# Patient Record
Sex: Female | Born: 1960 | Race: White | Hispanic: No | Marital: Married | Smoking: Never smoker
Health system: Southern US, Community
[De-identification: ages and names within clinical notes are randomized; demographics above are authoritative.]

## PROBLEM LIST (undated history)

## (undated) DIAGNOSIS — R112 Nausea with vomiting, unspecified: Secondary | ICD-10-CM

## (undated) DIAGNOSIS — T7840XA Allergy, unspecified, initial encounter: Secondary | ICD-10-CM

## (undated) DIAGNOSIS — N83209 Unspecified ovarian cyst, unspecified side: Secondary | ICD-10-CM

## (undated) DIAGNOSIS — Z923 Personal history of irradiation: Secondary | ICD-10-CM

## (undated) DIAGNOSIS — G43909 Migraine, unspecified, not intractable, without status migrainosus: Secondary | ICD-10-CM

## (undated) DIAGNOSIS — Z9889 Other specified postprocedural states: Secondary | ICD-10-CM

## (undated) DIAGNOSIS — T8859XA Other complications of anesthesia, initial encounter: Secondary | ICD-10-CM

## (undated) DIAGNOSIS — T4145XA Adverse effect of unspecified anesthetic, initial encounter: Secondary | ICD-10-CM

## (undated) DIAGNOSIS — C210 Malignant neoplasm of anus, unspecified: Secondary | ICD-10-CM

## (undated) DIAGNOSIS — K625 Hemorrhage of anus and rectum: Secondary | ICD-10-CM

## (undated) HISTORY — DX: Allergy, unspecified, initial encounter: T78.40XA

## (undated) HISTORY — DX: Malignant neoplasm of anus, unspecified: C21.0

## (undated) HISTORY — DX: Migraine, unspecified, not intractable, without status migrainosus: G43.909

## (undated) HISTORY — DX: Personal history of irradiation: Z92.3

## (undated) HISTORY — PX: HERNIA REPAIR: SHX51

## (undated) HISTORY — DX: Unspecified ovarian cyst, unspecified side: N83.209

## (undated) HISTORY — DX: Hemorrhage of anus and rectum: K62.5

---

## 1997-07-10 ENCOUNTER — Other Ambulatory Visit: Admission: RE | Admit: 1997-07-10 | Discharge: 1997-07-10 | Payer: Self-pay | Admitting: Gynecology

## 1999-06-28 ENCOUNTER — Encounter: Admission: RE | Admit: 1999-06-28 | Discharge: 1999-09-26 | Payer: Self-pay | Admitting: Obstetrics and Gynecology

## 1999-09-27 ENCOUNTER — Encounter: Admission: RE | Admit: 1999-09-27 | Discharge: 1999-12-26 | Payer: Self-pay | Admitting: Obstetrics and Gynecology

## 1999-12-28 ENCOUNTER — Encounter: Admission: RE | Admit: 1999-12-28 | Discharge: 2000-03-27 | Payer: Self-pay | Admitting: Obstetrics and Gynecology

## 2000-03-29 ENCOUNTER — Encounter: Admission: RE | Admit: 2000-03-29 | Discharge: 2000-05-20 | Payer: Self-pay | Admitting: Obstetrics and Gynecology

## 2000-07-14 ENCOUNTER — Emergency Department (HOSPITAL_COMMUNITY): Admission: EM | Admit: 2000-07-14 | Discharge: 2000-07-15 | Payer: Self-pay | Admitting: Emergency Medicine

## 2007-03-18 HISTORY — PX: OTHER SURGICAL HISTORY: SHX169

## 2009-01-19 ENCOUNTER — Encounter: Admission: RE | Admit: 2009-01-19 | Discharge: 2009-01-19 | Payer: Self-pay | Admitting: Family Medicine

## 2009-03-17 HISTORY — PX: BREAST ENHANCEMENT SURGERY: SHX7

## 2009-06-28 ENCOUNTER — Encounter: Admission: RE | Admit: 2009-06-28 | Discharge: 2009-06-28 | Payer: Self-pay | Admitting: Obstetrics and Gynecology

## 2009-12-12 ENCOUNTER — Ambulatory Visit: Payer: Self-pay | Admitting: Cardiology

## 2009-12-13 ENCOUNTER — Ambulatory Visit: Payer: Self-pay | Admitting: Cardiology

## 2011-07-17 ENCOUNTER — Other Ambulatory Visit: Payer: Self-pay | Admitting: Obstetrics and Gynecology

## 2011-07-17 DIAGNOSIS — Z1231 Encounter for screening mammogram for malignant neoplasm of breast: Secondary | ICD-10-CM

## 2011-07-21 ENCOUNTER — Ambulatory Visit: Payer: Self-pay

## 2011-07-30 ENCOUNTER — Ambulatory Visit
Admission: RE | Admit: 2011-07-30 | Discharge: 2011-07-30 | Disposition: A | Discharge status: Home / Self Care | Payer: 59 | Source: Ambulatory Visit | Attending: Obstetrics and Gynecology | Admitting: Obstetrics and Gynecology

## 2011-07-30 DIAGNOSIS — Z1231 Encounter for screening mammogram for malignant neoplasm of breast: Secondary | ICD-10-CM

## 2011-08-16 ENCOUNTER — Other Ambulatory Visit: Payer: Self-pay | Admitting: *Deleted

## 2011-08-16 ENCOUNTER — Encounter: Payer: Self-pay | Admitting: *Deleted

## 2012-05-10 ENCOUNTER — Encounter (INDEPENDENT_AMBULATORY_CARE_PROVIDER_SITE_OTHER): Payer: Self-pay | Admitting: Surgery

## 2012-05-11 ENCOUNTER — Ambulatory Visit (INDEPENDENT_AMBULATORY_CARE_PROVIDER_SITE_OTHER): Payer: 59 | Admitting: Surgery

## 2012-05-11 ENCOUNTER — Encounter (INDEPENDENT_AMBULATORY_CARE_PROVIDER_SITE_OTHER): Payer: Self-pay | Admitting: Surgery

## 2012-05-11 VITALS — BP 122/82 | HR 79 | Temp 98.0°F | Ht 63.0 in | Wt 111.0 lb

## 2012-05-11 DIAGNOSIS — K602 Anal fissure, unspecified: Secondary | ICD-10-CM

## 2012-05-11 MED ORDER — OXYCODONE-ACETAMINOPHEN 5-325 MG PO TABS: 1.0000 | ORAL_TABLET | ORAL | Status: DC | PRN

## 2012-05-11 NOTE — Progress Notes (Signed)
Patient ID: Leah Le, female   DOB: [birth year], 52 y.o.   MRN: [id number]  Chief Complaint  Patient presents with  . New Evaluation    fissure    HPI Leah Le is a 52 y.o. female.  Referred by Dr. Vida Rigger for evaluation of painful anal fissure HPI This is a 52 year old female in good health who presents with several months of severe perianal pain and hemorrhoids. The patient reports that she previously had regular daily bowel movements without constipation. However around makes giving she had an episode of constipation that required significant straining.  Since that time she has had worsening perianal pain. She has also noted some swelling anteriorly. Occasionally she sees blood on the toilet paper. She has been using an herbal laxative which helps to soften her stools. She has used multiple over-the-counter creams and wipes with no improvement. She feels constant fullness and pressure in her rectum. The pain has worsened to the point that she is unable to rest comfortably. She was seen last week by Dr. Ewing Schlein who diagnosed her with an anal fissure. She was started on the Rectiv nitroglycerin ointment as well as lidocaine ointment.  She has not noticed any improvement at all. She has not had any previous colonoscopies. Family history is negative for colon cancer but she does have a cousin with Crohn's disease.  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   Past Medical History  Diagnosis Date  . Allergy   . Migraine   . Ovarian cyst     Past Surgical History  Procedure Laterality Date  . Hernia repair    . Breast enhancement surgery    . Tummy tuck      Family History  Problem Relation Age of Onset  . Other Father     amyloidosis    Social History History  Substance Use Topics  . Smoking status: Never Smoker   . Smokeless tobacco: Never Used  . Alcohol Use: Yes     Comment: occasionally    Allergies  Allergen  Reactions  . Tetanus Toxoids     Current Outpatient Prescriptions  Medication Sig Dispense Refill  . ALPRAZolam (XANAX) 0.5 MG tablet Take 0.5 mg by mouth at bedtime as needed for sleep.      . Nutritional Supplements (JUICE PLUS FIBRE PO) Take by mouth.      . oxyCODONE-acetaminophen (PERCOCET/ROXICET) 5-325 MG per tablet Take 1 tablet by mouth every 4 (four) hours as needed for pain.  40 tablet  0   No current facility-administered medications for this visit.    Review of Systems Review of Systems  Constitutional: Negative for fever, chills and unexpected weight change.  HENT: Negative for hearing loss, congestion, sore throat, trouble swallowing and voice change.   Eyes: Negative for visual disturbance.  Respiratory: Negative for cough and wheezing.   Cardiovascular: Negative for chest pain, palpitations and leg swelling.  Gastrointestinal: Positive for diarrhea, blood in stool and rectal pain. Negative for nausea, vomiting, abdominal pain, constipation, abdominal distention and anal bleeding.  Genitourinary: Positive for pelvic pain. Negative for hematuria, vaginal bleeding and difficulty urinating.  Musculoskeletal: Negative for arthralgias.  Skin: Negative for rash and wound.  Neurological: Negative for seizures, syncope and headaches.  Hematological: Negative for adenopathy. Does not bruise/bleed easily.  Psychiatric/Behavioral: Negative for confusion.    Blood pressure 122/82, pulse 79, temperature 98 F (36.7 C), temperature source Temporal, height 5\' 3"  (1.6 m), weight  111 lb (50.349 kg).  Physical Exam Physical Exam WDWN in NAD HEENT:  EOMI, sclera anicteric Neck:  No masses, no thyromegaly Lungs:  CTA bilaterally; normal respiratory effort CV:  Regular rate and rhythm; no murmurs Abd:  +bowel sounds, soft, non-tender, no masses Rectal:  Small mildly inflamed anterior external hemorrhoid with adjacent fissure - moderately tender Left lateral enlarged external  hemorrhoid with apparent thrombosis and significant firmness/tenderness immediately adjacent to the hemorrhoid; no obvious sign of perirectal abscess Ext:  Well-perfused; no edema Skin:  Warm, dry; no sign of jaundice  Data Reviewed none  Assessment    Anterior midline skin tag and anal fissure Left lateral thrombosed hemorrhoid     Plan    PRN pain medication Frequent sitz baths Recommend examination under anesthesia with hemorrhoidectomy as soon as possible.  I don't think that she has a peri-rectal abscess, but the firmness around the left hemorrhoid is unusual.  We will also excise the anterior skin tag and possibly close the fissure.  The surgical procedure has been discussed with the patient.  Potential risks, benefits, alternative treatments, and expected outcomes have been explained.  All of the patient's questions at this time have been answered.  The likelihood of reaching the patient's treatment goal is good.  The patient understand the proposed surgical procedure and wishes to proceed as soon as possible.         Kathrynne Kulinski K. 05/11/2012, 5:51 PM

## 2012-05-12 ENCOUNTER — Encounter (HOSPITAL_COMMUNITY): Payer: Self-pay | Admitting: *Deleted

## 2012-05-13 ENCOUNTER — Encounter (HOSPITAL_COMMUNITY)
Admission: RE | Disposition: A | Discharge status: Home / Self Care | Payer: Self-pay | Source: Ambulatory Visit | Attending: Surgery

## 2012-05-13 ENCOUNTER — Ambulatory Visit (HOSPITAL_COMMUNITY)
Admission: RE | Admit: 2012-05-13 | Discharge: 2012-05-13 | Disposition: A | Discharge status: Home / Self Care | Payer: 59 | Source: Ambulatory Visit | Attending: Surgery | Admitting: Surgery

## 2012-05-13 ENCOUNTER — Encounter (HOSPITAL_COMMUNITY): Payer: Self-pay | Admitting: *Deleted

## 2012-05-13 ENCOUNTER — Encounter (HOSPITAL_COMMUNITY): Payer: Self-pay | Admitting: Anesthesiology

## 2012-05-13 ENCOUNTER — Ambulatory Visit (HOSPITAL_COMMUNITY): Payer: 59 | Admitting: Anesthesiology

## 2012-05-13 DIAGNOSIS — K645 Perianal venous thrombosis: Secondary | ICD-10-CM | POA: Insufficient documentation

## 2012-05-13 DIAGNOSIS — C210 Malignant neoplasm of anus, unspecified: Secondary | ICD-10-CM

## 2012-05-13 DIAGNOSIS — C20 Malignant neoplasm of rectum: Secondary | ICD-10-CM | POA: Insufficient documentation

## 2012-05-13 HISTORY — DX: Adverse effect of unspecified anesthetic, initial encounter: T41.45XA

## 2012-05-13 HISTORY — PX: HEMORRHOID SURGERY: SHX153

## 2012-05-13 HISTORY — DX: Other complications of anesthesia, initial encounter: T88.59XA

## 2012-05-13 HISTORY — DX: Other specified postprocedural states: Z98.890

## 2012-05-13 HISTORY — DX: Malignant neoplasm of anus, unspecified: C21.0

## 2012-05-13 HISTORY — DX: Nausea with vomiting, unspecified: R11.2

## 2012-05-13 HISTORY — PX: OTHER SURGICAL HISTORY: SHX169

## 2012-05-13 HISTORY — PX: EXAMINATION UNDER ANESTHESIA: SHX1540

## 2012-05-13 LAB — COMPREHENSIVE METABOLIC PANEL
ALT: 8 U/L (ref 0–35)
AST: 18 U/L (ref 0–37)
Calcium: 9.2 mg/dL (ref 8.4–10.5)
Sodium: 137 mEq/L (ref 135–145)
Total Protein: 7.3 g/dL (ref 6.0–8.3)

## 2012-05-13 LAB — CBC
MCHC: 35.9 g/dL (ref 30.0–36.0)
Platelets: 287 10*3/uL (ref 150–400)
RBC: 4.36 MIL/uL (ref 3.87–5.11)
WBC: 6 10*3/uL (ref 4.0–10.5)

## 2012-05-13 LAB — SURGICAL PCR SCREEN
MRSA, PCR: NEGATIVE
Staphylococcus aureus: NEGATIVE

## 2012-05-13 SURGERY — EXAM UNDER ANESTHESIA
Anesthesia: General | Wound class: Clean Contaminated

## 2012-05-13 MED ORDER — PROMETHAZINE HCL 12.5 MG PO TABS: 12.5000 mg | ORAL_TABLET | Freq: Four times a day (QID) | ORAL | Status: DC | PRN

## 2012-05-13 MED ORDER — OXYCODONE HCL 5 MG PO TABS: 5.0000 mg | ORAL_TABLET | Freq: Once | ORAL | Status: DC | PRN

## 2012-05-13 MED ORDER — ONDANSETRON HCL 4 MG/2ML IJ SOLN: 4.0000 mg | INTRAMUSCULAR | Status: DC | PRN

## 2012-05-13 MED ORDER — SCOPOLAMINE 1 MG/3DAYS TD PT72: 1.0000 | MEDICATED_PATCH | TRANSDERMAL | Status: DC

## 2012-05-13 MED ORDER — DEXAMETHASONE SODIUM PHOSPHATE 4 MG/ML IJ SOLN
INTRAMUSCULAR | Status: DC | PRN
  Administered 2012-05-13: 8 mg via INTRAVENOUS

## 2012-05-13 MED ORDER — MIDAZOLAM HCL 2 MG/2ML IJ SOLN
INTRAMUSCULAR | Status: AC
  Administered 2012-05-13 (×2): 1 mg
  Filled 2012-05-13: qty 2

## 2012-05-13 MED ORDER — MIDAZOLAM HCL 5 MG/5ML IJ SOLN
INTRAMUSCULAR | Status: DC | PRN
  Administered 2012-05-13: 2 mg via INTRAVENOUS

## 2012-05-13 MED ORDER — SODIUM CHLORIDE 0.9 % IV SOLN: 12.5000 mg | INTRAVENOUS | Status: DC | PRN

## 2012-05-13 MED ORDER — MUPIROCIN 2 % EX OINT
TOPICAL_OINTMENT | CUTANEOUS | Status: AC
  Administered 2012-05-13: 1
  Filled 2012-05-13: qty 22

## 2012-05-13 MED ORDER — ONDANSETRON HCL 4 MG/2ML IJ SOLN
INTRAMUSCULAR | Status: DC | PRN
  Administered 2012-05-13: 4 mg via INTRAVENOUS

## 2012-05-13 MED ORDER — ONDANSETRON HCL 4 MG/2ML IJ SOLN: 4.0000 mg | Freq: Once | INTRAMUSCULAR | Status: DC | PRN

## 2012-05-13 MED ORDER — BUPIVACAINE LIPOSOME 1.3 % IJ SUSP
INTRAMUSCULAR | Status: DC | PRN
  Administered 2012-05-13: 20 mL

## 2012-05-13 MED ORDER — HYDROMORPHONE HCL PF 1 MG/ML IJ SOLN: 1.0000 mg | INTRAMUSCULAR | Status: DC | PRN

## 2012-05-13 MED ORDER — LIDOCAINE HCL (CARDIAC) 20 MG/ML IV SOLN
INTRAVENOUS | Status: DC | PRN
  Administered 2012-05-13: 100 mg via INTRAVENOUS

## 2012-05-13 MED ORDER — HYDROXYZINE HCL 50 MG/ML IM SOLN: 25.0000 mg | Freq: Four times a day (QID) | INTRAMUSCULAR | Status: DC | PRN

## 2012-05-13 MED ORDER — HYDROXYZINE HCL 50 MG/ML IM SOLN
25.0000 mg | INTRAMUSCULAR | Status: DC
  Filled 2012-05-13: qty 0.5

## 2012-05-13 MED ORDER — MEPERIDINE HCL 25 MG/ML IJ SOLN: 6.2500 mg | INTRAMUSCULAR | Status: DC | PRN

## 2012-05-13 MED ORDER — LACTATED RINGERS IV SOLN
INTRAVENOUS | Status: DC
  Administered 2012-05-13 (×2): via INTRAVENOUS

## 2012-05-13 MED ORDER — SCOPOLAMINE 1 MG/3DAYS TD PT72
MEDICATED_PATCH | TRANSDERMAL | Status: AC
  Administered 2012-05-13: 1.5 mg via TRANSDERMAL
  Filled 2012-05-13: qty 1

## 2012-05-13 MED ORDER — MIDAZOLAM HCL 2 MG/2ML IJ SOLN: 1.0000 mg | Freq: Once | INTRAMUSCULAR | Status: DC

## 2012-05-13 MED ORDER — HYDROMORPHONE HCL PF 1 MG/ML IJ SOLN
INTRAMUSCULAR | Status: AC
  Filled 2012-05-13: qty 1

## 2012-05-13 MED ORDER — PROPOFOL 10 MG/ML IV BOLUS
INTRAVENOUS | Status: DC | PRN
  Administered 2012-05-13: 140 mg via INTRAVENOUS

## 2012-05-13 MED ORDER — FENTANYL CITRATE 0.05 MG/ML IJ SOLN
INTRAMUSCULAR | Status: DC | PRN
  Administered 2012-05-13 (×2): 50 ug via INTRAVENOUS
  Administered 2012-05-13: 100 ug via INTRAVENOUS

## 2012-05-13 MED ORDER — OXYCODONE-ACETAMINOPHEN 5-325 MG PO TABS: 1.0000 | ORAL_TABLET | ORAL | Status: DC | PRN

## 2012-05-13 MED ORDER — HYDROMORPHONE HCL PF 1 MG/ML IJ SOLN
0.2500 mg | INTRAMUSCULAR | Status: DC | PRN
  Administered 2012-05-13: 0.5 mg via INTRAVENOUS

## 2012-05-13 MED ORDER — OXYCODONE HCL 5 MG/5ML PO SOLN: 5.0000 mg | Freq: Once | ORAL | Status: DC | PRN

## 2012-05-13 MED ORDER — BUPIVACAINE LIPOSOME 1.3 % IJ SUSP
20.0000 mL | INTRAMUSCULAR | Status: AC
  Administered 2012-05-13: 266 mg
  Filled 2012-05-13: qty 20

## 2012-05-13 MED ORDER — HEMOSTATIC AGENTS (NO CHARGE) OPTIME
TOPICAL | Status: DC | PRN
  Administered 2012-05-13: 1 via TOPICAL

## 2012-05-13 SURGICAL SUPPLY — 39 items
BLADE SURG 15 STRL LF DISP TIS (BLADE) ×1 IMPLANT
BLADE SURG 15 STRL SS (BLADE) ×1
CANISTER SUCTION 2500CC (MISCELLANEOUS) ×2 IMPLANT
CLOTH BEACON ORANGE TIMEOUT ST (SAFETY) ×2 IMPLANT
COVER MAYO STAND STRL (DRAPES) ×2 IMPLANT
COVER SURGICAL LIGHT HANDLE (MISCELLANEOUS) ×2 IMPLANT
DECANTER SPIKE VIAL GLASS SM (MISCELLANEOUS) ×2 IMPLANT
DRAPE UTILITY 15X26 W/TAPE STR (DRAPE) ×4 IMPLANT
DRSG PAD ABDOMINAL 8X10 ST (GAUZE/BANDAGES/DRESSINGS) ×2 IMPLANT
ELECT CAUTERY BLADE 6.4 (BLADE) ×2 IMPLANT
ELECT REM PT RETURN 9FT ADLT (ELECTROSURGICAL) ×2
ELECTRODE REM PT RTRN 9FT ADLT (ELECTROSURGICAL) ×1 IMPLANT
GAUZE SPONGE 4X4 16PLY XRAY LF (GAUZE/BANDAGES/DRESSINGS) ×2 IMPLANT
GLOVE BIO SURGEON STRL SZ7 (GLOVE) ×2 IMPLANT
GLOVE BIOGEL PI IND STRL 7.5 (GLOVE) ×1 IMPLANT
GLOVE BIOGEL PI INDICATOR 7.5 (GLOVE) ×1
GOWN STRL NON-REIN LRG LVL3 (GOWN DISPOSABLE) ×4 IMPLANT
KIT BASIN OR (CUSTOM PROCEDURE TRAY) ×2 IMPLANT
KIT ROOM TURNOVER OR (KITS) ×2 IMPLANT
NEEDLE HYPO 25GX1X1/2 BEV (NEEDLE) ×2 IMPLANT
NS IRRIG 1000ML POUR BTL (IV SOLUTION) ×2 IMPLANT
PACK LITHOTOMY IV (CUSTOM PROCEDURE TRAY) ×2 IMPLANT
PAD ARMBOARD 7.5X6 YLW CONV (MISCELLANEOUS) ×4 IMPLANT
PENCIL BUTTON HOLSTER BLD 10FT (ELECTRODE) ×2 IMPLANT
SHEARS HARMONIC 9CM CVD (BLADE) IMPLANT
SPECIMEN JAR SMALL (MISCELLANEOUS) IMPLANT
SPONGE GAUZE 4X4 12PLY (GAUZE/BANDAGES/DRESSINGS) ×2 IMPLANT
SURGILUBE 2OZ TUBE FLIPTOP (MISCELLANEOUS) ×2 IMPLANT
SUT CHROMIC 2 0 SH (SUTURE) IMPLANT
SUT CHROMIC 3 0 SH 27 (SUTURE) IMPLANT
SUT VIC AB 3-0 SH 18 (SUTURE) ×4 IMPLANT
SYR BULB 3OZ (MISCELLANEOUS) ×2 IMPLANT
SYR CONTROL 10ML LL (SYRINGE) ×2 IMPLANT
TOWEL OR 17X24 6PK STRL BLUE (TOWEL DISPOSABLE) ×2 IMPLANT
TOWEL OR 17X26 10 PK STRL BLUE (TOWEL DISPOSABLE) ×2 IMPLANT
TRAY PROCTOSCOPIC FIBER OPTIC (SET/KITS/TRAYS/PACK) IMPLANT
TUBE CONNECTING 12X1/4 (SUCTIONS) ×2 IMPLANT
WATER STERILE IRR 1000ML POUR (IV SOLUTION) ×2 IMPLANT
YANKAUER SUCT BULB TIP NO VENT (SUCTIONS) ×2 IMPLANT

## 2012-05-13 NOTE — H&P (View-Only) (Signed)
Patient ID: Leah Le, female   DOB: 05/12/1960, 52 y.o.   MRN: [id number]  Chief Complaint  Patient presents with  . New Evaluation    fissure    HPI Leah Le is a 52 y.o. female.  Referred by Dr. Marc Magod for evaluation of painful anal fissure HPI This is a 52-year-old female in good health who presents with several months of severe perianal pain and hemorrhoids. The patient reports that she previously had regular daily bowel movements without constipation. However around makes giving she had an episode of constipation that required significant straining.  Since that time she has had worsening perianal pain. She has also noted some swelling anteriorly. Occasionally she sees blood on the toilet paper. She has been using an herbal laxative which helps to soften her stools. She has used multiple over-the-counter creams and wipes with no improvement. She feels constant fullness and pressure in her rectum. The pain has worsened to the point that she is unable to rest comfortably. She was seen last week by Dr. Magod who diagnosed her with an anal fissure. She was started on the Rectiv nitroglycerin ointment as well as lidocaine ointment.  She has not noticed any improvement at all. She has not had any previous colonoscopies. Family history is negative for colon cancer but she does have a cousin with Crohn's disease.  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   Past Medical History  Diagnosis Date  . Allergy   . Migraine   . Ovarian cyst     Past Surgical History  Procedure Laterality Date  . Hernia repair    . Breast enhancement surgery    . Tummy tuck      Family History  Problem Relation Age of Onset  . Other Father     amyloidosis    Social History History  Substance Use Topics  . Smoking status: Never Smoker   . Smokeless tobacco: Never Used  . Alcohol Use: Yes     Comment: occasionally    Allergies  Allergen  Reactions  . Tetanus Toxoids     Current Outpatient Prescriptions  Medication Sig Dispense Refill  . ALPRAZolam (XANAX) 0.5 MG tablet Take 0.5 mg by mouth at bedtime as needed for sleep.      . Nutritional Supplements (JUICE PLUS FIBRE PO) Take by mouth.      . oxyCODONE-acetaminophen (PERCOCET/ROXICET) 5-325 MG per tablet Take 1 tablet by mouth every 4 (four) hours as needed for pain.  40 tablet  0   No current facility-administered medications for this visit.    Review of Systems Review of Systems  Constitutional: Negative for fever, chills and unexpected weight change.  HENT: Negative for hearing loss, congestion, sore throat, trouble swallowing and voice change.   Eyes: Negative for visual disturbance.  Respiratory: Negative for cough and wheezing.   Cardiovascular: Negative for chest pain, palpitations and leg swelling.  Gastrointestinal: Positive for diarrhea, blood in stool and rectal pain. Negative for nausea, vomiting, abdominal pain, constipation, abdominal distention and anal bleeding.  Genitourinary: Positive for pelvic pain. Negative for hematuria, vaginal bleeding and difficulty urinating.  Musculoskeletal: Negative for arthralgias.  Skin: Negative for rash and wound.  Neurological: Negative for seizures, syncope and headaches.  Hematological: Negative for adenopathy. Does not bruise/bleed easily.  Psychiatric/Behavioral: Negative for confusion.    Blood pressure 122/82, pulse 79, temperature 98 F (36.7 C), temperature source Temporal, height 5' 3" (1.6 m), weight   111 lb (50.349 kg).  Physical Exam Physical Exam WDWN in NAD HEENT:  EOMI, sclera anicteric Neck:  No masses, no thyromegaly Lungs:  CTA bilaterally; normal respiratory effort CV:  Regular rate and rhythm; no murmurs Abd:  +bowel sounds, soft, non-tender, no masses Rectal:  Small mildly inflamed anterior external hemorrhoid with adjacent fissure - moderately tender Left lateral enlarged external  hemorrhoid with apparent thrombosis and significant firmness/tenderness immediately adjacent to the hemorrhoid; no obvious sign of perirectal abscess Ext:  Well-perfused; no edema Skin:  Warm, dry; no sign of jaundice  Data Reviewed none  Assessment    Anterior midline skin tag and anal fissure Left lateral thrombosed hemorrhoid     Plan    PRN pain medication Frequent sitz baths Recommend examination under anesthesia with hemorrhoidectomy as soon as possible.  I don't think that she has a peri-rectal abscess, but the firmness around the left hemorrhoid is unusual.  We will also excise the anterior skin tag and possibly close the fissure.  The surgical procedure has been discussed with the patient.  Potential risks, benefits, alternative treatments, and expected outcomes have been explained.  All of the patient's questions at this time have been answered.  The likelihood of reaching the patient's treatment goal is good.  The patient understand the proposed surgical procedure and wishes to proceed as soon as possible.         Valinda Fedie K. 05/11/2012, 5:51 PM    

## 2012-05-13 NOTE — Anesthesia Postprocedure Evaluation (Signed)
Anesthesia Post Note  Patient: Leah Le  Procedure(s) Performed: Procedure(s) (LRB): EXAM UNDER ANESTHESIA (N/A) HEMORRHOIDECTOMY (N/A)  Anesthesia type: general  Patient location: PACU  Post pain: Pain level controlled  Post assessment: Patient's Cardiovascular Status Stable  Last Vitals:  Filed Vitals:   05/13/12 1300  BP: 117/66  Pulse: 51  Temp:   Resp: 14    Post vital signs: Reviewed and stable  Level of consciousness: sedated  Complications: No apparent anesthesia complications

## 2012-05-13 NOTE — Interval H&P Note (Signed)
History and Physical Interval Note:  05/13/2012 10:22 AM  Leah Le  has presented today for surgery, with the diagnosis of anal fissure, thrombored hemorrhoid  The various methods of treatment have been discussed with the patient and family. After consideration of risks, benefits and other options for treatment, the patient has consented to  Procedure(s): EXAM UNDER ANESTHESIA (N/A) HEMORRHOIDECTOMY (N/A) as a surgical intervention .  The patient's history has been reviewed, patient examined, no change in status, stable for surgery.  I have reviewed the patient's chart and labs.  Questions were answered to the patient's satisfaction.     [name]

## 2012-05-13 NOTE — Preoperative (Signed)
Beta Blockers   Reason not to administer Beta Blockers:Not Applicable 

## 2012-05-13 NOTE — Transfer of Care (Signed)
Immediate Anesthesia Transfer of Care Note  Patient: Leah Le  Procedure(s) Performed: Procedure(s) with comments: EXAM UNDER ANESTHESIA (N/A) HEMORRHOIDECTOMY (N/A) - biopsies of anal lesions  Patient Location: PACU  Anesthesia Type:General  Level of Consciousness: awake and alert   Airway & Oxygen Therapy: Patient connected to nasal cannula oxygen  Post-op Assessment: Report given to PACU RN and Post -op Vital signs reviewed and stable  Post vital signs: Reviewed and stable  Complications: No apparent anesthesia complications

## 2012-05-13 NOTE — Anesthesia Preprocedure Evaluation (Signed)
Anesthesia Evaluation  Patient identified by MRN, date of birth, ID band Patient awake    Reviewed: Allergy & Precautions, H&P , NPO status , Patient's Chart, lab work & pertinent test results  History of Anesthesia Complications (+) PONV  Airway Mallampati: I TM Distance: >3 FB Neck ROM: Full    Dental   Pulmonary          Cardiovascular     Neuro/Psych    GI/Hepatic   Endo/Other    Renal/GU      Musculoskeletal   Abdominal   Peds  Hematology   Anesthesia Other Findings   Reproductive/Obstetrics                           Anesthesia Physical Anesthesia Plan  ASA: II  Anesthesia Plan: General   Post-op Pain Management:    Induction: Intravenous  Airway Management Planned: Oral ETT  Additional Equipment:   Intra-op Plan:   Post-operative Plan: Extubation in OR  Informed Consent: I have reviewed the patients History and Physical, chart, labs and discussed the procedure including the risks, benefits and alternatives for the proposed anesthesia with the patient or authorized representative who has indicated his/her understanding and acceptance.     Plan Discussed with: CRNA and Surgeon  Anesthesia Plan Comments: (Pt had rx to Chlohexidine wipes. Had strange rxn to [place]. Will treat with vistaril IM.)        Anesthesia Quick Evaluation

## 2012-05-13 NOTE — Op Note (Addendum)
Preop diagnosis: Anal fissure and thrombosed hemorrhoid Postop diagnosis: low rectal adenocarcinoma Procedure:  Examination under anesthesia; hemorrhoidectomy; biopsy of rectal lesion Surgeon:  Syanna Remmert K. Assistant:  Dr. Violeta Gelinas Anesthesia: Gen. Via LMA Indications: This is a 52 year old female who presents with several months of pain with bowel movements. This has been worsening over the last several weeks. She has been seeing a little bit of blood he wipes. She describes a full sensation in her rectum. She was examined in the office 2 days ago. She was far too tender for a digital rectal examination. I could visualize an enlarged external hemorrhoid anteriorly with what appeared to be a small adjacent fissure. Also seemed to be some thickening and possible thrombosis on the left lateral side of the anus. This was felt to possibly represent a thrombosed hemorrhoid. The patient was far too tender for any intervention in the office so we posted her for examination under anesthesia.  Description of procedure: The patient is brought to the operating room and placed in a supine position on the operating room table. After an adequate level of general anesthesia was obtained, the patient's legs were placed in lithotomy position in yellowfin stirrups. Her perineum was prepped with Betadine and draped in sterile fashion. A timeout was taken to ensure the proper patient and proper procedure.  We injected 20 mL's of Exparel into the intersphincteric groove circumferentially. I inserted a lubricated finger and immediately felt a firm mass that seemed to begin anteriorly and but completely around the left side to the posterior midline. This did not feel like atypical hemorrhoid. I inserted a clean finger into the vagina and there does not seem to be any involvement of the posterior vaginal wall. The firmness extends up about 4 cm. I was able to dilate the anal sphincter up to 3 fingers and inserted the  retractor. Grossly this firm mass is highly suspicious for malignancy. This seems to be a fungating area anteriorly and another one in the left lateral location. We excised parts of both of these areas with the harmonic scalpel. These were sent for frozen section.  I asked Dr. Violeta Gelinas, one of my partners, to examine the patient with me.  He agreed that this large lesion was highly suspicious for anal or rectal carcinoma.  He concurred with the plan for biopsy for diagnosis at this time. We then cauterized for hemostasis. The right side seems to be free of any disease. I cannot visualize any lesions further up in the rectum. Packed the rectum with a sponge while we were waiting for the frozen section. The preliminary reading on the frozen section shows a diagnosis of adenocarcinoma with overlying squamous epithelium. It appears that this is a very low rectal cancer with tracking out to the anus.  We irrigated thoroughly and inspected for hemostasis. The rectum was packed with a Gelfoam sponge. The patient was then extubated and brought to the recovery room in stable condition. All sponge, initially, and needle counts are correct  Wilmon Arms. Corliss Skains, MD, [facility]  05/13/2012 12:17 PM

## 2012-05-13 NOTE — Anesthesia Procedure Notes (Signed)
Procedure Name: LMA Insertion Date/Time: 05/13/2012 10:56 AM Performed by: Quentin Ore Pre-anesthesia Checklist: Patient identified, Emergency Drugs available, Suction available, Patient being monitored and Timeout performed Patient Re-evaluated:Patient Re-evaluated prior to inductionOxygen Delivery Method: Circle system utilized Preoxygenation: Pre-oxygenation with 100% oxygen Intubation Type: IV induction Ventilation: Mask ventilation without difficulty LMA: LMA inserted LMA Size: 4.0 Number of attempts: 1 Placement Confirmation: positive ETCO2 and breath sounds checked- equal and bilateral Tube secured with: Tape Dental Injury: Teeth and Oropharynx as per pre-operative assessment

## 2012-05-13 NOTE — Progress Notes (Signed)
Pt started itching severly almost immediately after using CHG wipes/--wiped her down with a warm cloth and notified Dr.Ossey-will give pt iv benadryl in holding

## 2012-05-14 ENCOUNTER — Telehealth (INDEPENDENT_AMBULATORY_CARE_PROVIDER_SITE_OTHER): Payer: Self-pay | Admitting: General Surgery

## 2012-05-14 ENCOUNTER — Other Ambulatory Visit (INDEPENDENT_AMBULATORY_CARE_PROVIDER_SITE_OTHER): Payer: Self-pay | Admitting: Surgery

## 2012-05-14 LAB — CEA: CEA: 1 ng/mL (ref 0.0–5.0)

## 2012-05-14 NOTE — Telephone Encounter (Signed)
Called patient to let her know that she has apt on 05-20-12 at the 301 @ 2:15 for 2:30 apt time for both test, CT Chest and A/P. And she will have a family member to go and get the contrast before test. She is also aware that I have faxed and sent through epic to have [facility] to [facility] for a apt to see Dr Truett Perna and someone in rad. I sent everything to Vicie Mutters at the cancer ctr. And I also stated in the notes to the cancer ctr when she is schedule for her test.  Leah Le is aware she needs me for anything or question to please call up her and ask for [facility]

## 2012-05-16 ENCOUNTER — Encounter (HOSPITAL_COMMUNITY): Payer: Self-pay | Admitting: Surgery

## 2012-05-17 ENCOUNTER — Telehealth (INDEPENDENT_AMBULATORY_CARE_PROVIDER_SITE_OTHER): Payer: Self-pay | Admitting: General Surgery

## 2012-05-17 ENCOUNTER — Telehealth: Payer: Self-pay | Admitting: *Deleted

## 2012-05-17 ENCOUNTER — Encounter (INDEPENDENT_AMBULATORY_CARE_PROVIDER_SITE_OTHER): Payer: Self-pay | Admitting: Surgery

## 2012-05-17 ENCOUNTER — Telehealth: Payer: Self-pay | Admitting: Oncology

## 2012-05-17 NOTE — Telephone Encounter (Signed)
Gina from the [facility] called and stated that patient will see Dr Mitzi Hansen on 3-10 @ 8:30 am and will see Dr Truett Perna on 3-11 @ 1:30. She stated that she will let the patient now about her upcoming apts

## 2012-05-17 NOTE — Telephone Encounter (Signed)
Dr Corliss Skains called the office and had Dr Magnus Ivan write Rx Oxycontin 10mg  1 po q 12 hrs #40 without refills. Patient husband will pick up Rx per Dr Corliss Skains

## 2012-05-17 NOTE — Telephone Encounter (Signed)
Spoke with patient by phone and confirmed appointments with Dr. Mitzi Hansen and Dr. Truett Perna for 05/24/12 and 05/25/12.  Contact names and phone numbers were provided.  Spoke with Pattricia Boss at Dr. Fatima Sanger  Office and informed of patient's appointment dates.

## 2012-05-17 NOTE — Telephone Encounter (Signed)
PT scheduled per Almira Coaster for 03/11 @ 1:30 w/Dr. Truett Perna Welcome packet mailed.

## 2012-05-18 ENCOUNTER — Encounter (HOSPITAL_COMMUNITY): Payer: Self-pay | Admitting: Pharmacy Technician

## 2012-05-18 ENCOUNTER — Encounter (HOSPITAL_COMMUNITY): Payer: Self-pay | Admitting: *Deleted

## 2012-05-18 ENCOUNTER — Telehealth: Payer: Self-pay | Admitting: Oncology

## 2012-05-18 NOTE — Telephone Encounter (Signed)
C/D 05/18/12 for appt. 05/25/12

## 2012-05-18 NOTE — Pre-Procedure Instructions (Signed)
Your procedure is scheduled on:Wednesday, May 19, 2012 Report to [facility] Admitting [id number] Call this number if you have problems morning of your procedure:[phone]  Follow all bowel prep instructions per your doctor's orders.  Do not eat or drink anything after midnight the night before your procedure. You may brush your teeth, rinse out your mouth, but no water, no food, no chewing gum, no mints, no candies, no chewing tobacco.     Take these medicines the morning of your procedure with A SIP OF WATER:May take pain medication if needed   Please make arrangements for a responsible person to drive you home after the procedure. You cannot go home by cab/taxi. We recommend you have someone with you at home the first 24 hours after your procedure. Driver for procedure is spouse Jorja Loa [phone]  LEAVE ALL VALUABLES, JEWELRY, BILLFOLD AT HOME.  NO DENTURES, CONTACT LENSES ALLOWED IN THE ENDOSCOPY ROOM.   YOU MAY WEAR DEODORANT, PLEASE REMOVE ALL JEWELRY, WATCHES RINGS, BODY PIERCINGS AND LEAVE AT HOME.   WOMEN: NO MAKE-UP, LOTIONS PERFUMES

## 2012-05-19 ENCOUNTER — Ambulatory Visit (HOSPITAL_COMMUNITY)
Admission: RE | Admit: 2012-05-19 | Discharge: 2012-05-19 | Disposition: A | Discharge status: Home / Self Care | Payer: BC Managed Care – PPO | Source: Ambulatory Visit | Attending: Gastroenterology | Admitting: Gastroenterology

## 2012-05-19 ENCOUNTER — Encounter (HOSPITAL_COMMUNITY)
Admission: RE | Disposition: A | Discharge status: Home / Self Care | Payer: Self-pay | Source: Ambulatory Visit | Attending: Gastroenterology

## 2012-05-19 ENCOUNTER — Encounter (HOSPITAL_COMMUNITY): Payer: Self-pay

## 2012-05-19 DIAGNOSIS — K921 Melena: Secondary | ICD-10-CM | POA: Insufficient documentation

## 2012-05-19 DIAGNOSIS — K6289 Other specified diseases of anus and rectum: Secondary | ICD-10-CM | POA: Insufficient documentation

## 2012-05-19 DIAGNOSIS — C20 Malignant neoplasm of rectum: Secondary | ICD-10-CM | POA: Insufficient documentation

## 2012-05-19 HISTORY — PX: EUS: SHX5427

## 2012-05-19 HISTORY — PX: COLONOSCOPY: SHX5424

## 2012-05-19 SURGERY — ULTRASOUND, LOWER GI TRACT, ENDOSCOPIC
Anesthesia: Moderate Sedation

## 2012-05-19 MED ORDER — MIDAZOLAM HCL 10 MG/2ML IJ SOLN
INTRAMUSCULAR | Status: AC
  Filled 2012-05-19: qty 4

## 2012-05-19 MED ORDER — LACTATED RINGERS IV SOLN: INTRAVENOUS | Status: DC

## 2012-05-19 MED ORDER — DIPHENHYDRAMINE HCL 50 MG/ML IJ SOLN
INTRAMUSCULAR | Status: AC
  Filled 2012-05-19: qty 1

## 2012-05-19 MED ORDER — FENTANYL CITRATE 0.05 MG/ML IJ SOLN
INTRAMUSCULAR | Status: DC | PRN
  Administered 2012-05-19 (×5): 25 ug via INTRAVENOUS

## 2012-05-19 MED ORDER — PROMETHAZINE HCL 25 MG/ML IJ SOLN
INTRAMUSCULAR | Status: AC
  Filled 2012-05-19: qty 1

## 2012-05-19 MED ORDER — MIDAZOLAM HCL 10 MG/2ML IJ SOLN
INTRAMUSCULAR | Status: DC | PRN
  Administered 2012-05-19: 2 mg via INTRAVENOUS
  Administered 2012-05-19 (×4): 2.5 mg via INTRAVENOUS

## 2012-05-19 MED ORDER — SODIUM CHLORIDE 0.9 % IV SOLN: INTRAVENOUS | Status: DC

## 2012-05-19 MED ORDER — FENTANYL CITRATE 0.05 MG/ML IJ SOLN
INTRAMUSCULAR | Status: AC
  Filled 2012-05-19: qty 4

## 2012-05-19 MED ORDER — PROMETHAZINE HCL 25 MG/ML IJ SOLN
INTRAMUSCULAR | Status: DC | PRN
  Administered 2012-05-19: 25 mg via INTRAVENOUS

## 2012-05-19 NOTE — H&P (Signed)
Patient interval history reviewed.  Patient examined again.  There has been no change from documented H/P dated 05/06/12 (scanned into chart from our office) except as documented above.   Assessment:  1.  Proctalgia with dyschezia. 2.  Anorectal mass, pathology showing adenocarcinoma.  Plan:  1.  Colonoscopy. 2.  Risks (bleeding, infection, bowel perforation that could require surgery, sedation-related changes in cardiopulmonary systems), benefits (identification and possible treatment of source of symptoms, exclusion of certain causes of symptoms), and alternatives (watchful waiting, radiographic imaging studies, empiric medical treatment) of colonoscopy were explained to patient/family in detail and patient wishes to proceed.

## 2012-05-19 NOTE — Op Note (Signed)
[facility] [address] [place], [address]   COLONOSCOPY PROCEDURE REPORT  PATIENT: Leah Le, Leah Le  MR#: [id number] BIRTHDATE: 12-19-1960 , 51  yrs. old GENDER: Female ENDOSCOPIST: Willis Modena, MD REFERRED AO:ZHYQM Corliss Blacker, M.D.  Vida Rigger, M.D.  Suzanna Obey, M.D. PROCEDURE DATE:  05/19/2012 PROCEDURE:   Colonoscopy and endorectal ultrasound ASA CLASS:   Class II INDICATIONS:proctalgia, rectal mass (biopsy-proven adenocarcinoma), blood in stool. MEDICATIONS: Fentanyl 125 mcg IV, Versed 12 mg IV, and Promethazine (Phenergan) 25mg  IV  DESCRIPTION OF PROCEDURE:   After the risks benefits and alternatives of the procedure were thoroughly explained, informed consent was obtained.  A digital rectal exam revealed a palpable rectal mass.   The     endoscope was introduced through the anus and advanced to the cecum, which was identified by both the appendix and ileocecal valve. No adverse events experienced.   The quality of the prep was good.  The instrument was then slowly withdrawn as the colon was fully examined.     Findings: COLONOSCOPY:  Palpable rectal mass, exquisitely tender to palpation. Prep quality was acceptable.  Endoscopically the tumor is completely circumferential and extends from 5cm (from the anal verge) all the way to the dentate line; there does even appear to be visible ulcerated tissue on the visible external margin of the anal sphincter. Mild melanosis coli seen, more pronounced in the proximal colon.  No other polyps, masses, vascular ectasias, or inflammatory changes were seen.  Unable to perform rectal retroflexion due to patient discomfort.    Endorectal Ultrasound: Tumor is completely circumferential exteding from the distal 4-5 cm of the rectum to the anal verge.  No neighboring adenopathy witnessed.  Tumor extends through the wall of the rectum in many locations.  There also appears to be involvement of the  anal sphincter musculature.   Withdrawal time was about 10 minutes.  The scope was withdrawn and the procedure completed.  ENDOSCOPIC IMPRESSION:     As above.  Distal rectal mass, as above. Colonoscopy otherwise normal to the cecum.  Locoregional staging of distal rectal adenocarcinoma is at least T3 N0 Mx via endorectal ultrasound.  RECOMMENDATIONS:     1.  Watch for potential complications of procedure. 2.  CT chest/abdomen/pelvis planned for tomorrow, for further tumor staging. 3.  Based on endorectal ultrasound findings, patient will likely need consideration of chemotherapy and local radiation prior to consideration of surgical resection. 4.  Will discuss with Dr. Ewing Schlein.  eSigned:  Willis Modena, MD 05/19/2012 3:27 PM   cc:

## 2012-05-20 ENCOUNTER — Encounter (HOSPITAL_COMMUNITY): Payer: Self-pay | Admitting: Gastroenterology

## 2012-05-20 ENCOUNTER — Ambulatory Visit
Admission: RE | Admit: 2012-05-20 | Discharge: 2012-05-20 | Disposition: A | Discharge status: Home / Self Care | Payer: BC Managed Care – PPO | Source: Ambulatory Visit | Attending: Surgery | Admitting: Surgery

## 2012-05-20 MED ORDER — IOHEXOL 300 MG/ML  SOLN
100.0000 mL | Freq: Once | INTRAMUSCULAR | Status: AC | PRN
  Administered 2012-05-20: 100 mL via INTRAVENOUS

## 2012-05-21 ENCOUNTER — Encounter: Payer: Self-pay | Admitting: Radiation Oncology

## 2012-05-21 DIAGNOSIS — N83209 Unspecified ovarian cyst, unspecified side: Secondary | ICD-10-CM

## 2012-05-21 DIAGNOSIS — G43909 Migraine, unspecified, not intractable, without status migrainosus: Secondary | ICD-10-CM | POA: Insufficient documentation

## 2012-05-24 ENCOUNTER — Encounter: Payer: Self-pay | Admitting: Radiation Oncology

## 2012-05-24 ENCOUNTER — Ambulatory Visit
Admission: RE | Admit: 2012-05-24 | Discharge: 2012-05-24 | Disposition: A | Discharge status: Home / Self Care | Payer: BC Managed Care – PPO | Source: Ambulatory Visit | Attending: Radiation Oncology | Admitting: Radiation Oncology

## 2012-05-24 VITALS — BP 122/74 | HR 70 | Temp 98.5°F | Wt 110.7 lb

## 2012-05-24 DIAGNOSIS — C20 Malignant neoplasm of rectum: Secondary | ICD-10-CM | POA: Insufficient documentation

## 2012-05-24 NOTE — Progress Notes (Signed)
Please see the Nurse Progress Note in the MD Initial Consult Encounter for this patient. 

## 2012-05-24 NOTE — Progress Notes (Signed)
Patient here with husband, and best friend for consultation of rectal cancer.Reviewed health history and explained reason for visit.

## 2012-05-24 NOTE — Progress Notes (Signed)
Radiation Oncology         [phone] ________________________________  Name: Leah Le MRN: [id number]  Date: 05/24/2012  DOB: 1960-05-31  WU:[id number],NWGNF, MD  Ladene Artist, MD   Manus Rudd, MD  REFERRING PHYSICIAN: Ladene Artist, MD   DIAGNOSIS: The encounter diagnosis was Rectal cancer.:   [id number]   HISTORY OF PRESENT ILLNESS::Leah Le is a 52 y.o. female who is seen for an initial consultation visit. The patient indicates that she has had some pain in the rectal/anal area for a number of months. She initially described this as being present for 3 months but she states that she did have some pain even earlier. However this really increase she believes around Thanksgiving. She initially thought that this was related to hemorrhoids and later then sought medical attention for this. The patient was seen by Dr. Corliss Skains who felt that the patient may have an anal fissure and thrombosed hemorrhoid. However on an exam under anesthesia the patient was found to have a suspicious mass for malignancy in the distal rectum.   Biopsy of the suspicious mass returned positive for adenocarcinoma. Lymphovascular space invasion was identified.  The patient's workup has further included a colonoscopy and in the rectal ultrasound. The patient was noted to have a mass from 5 cm from the anal verge down distally to the dentate line. It was noted that there was some possible visible ulcerated tissue on the visible external margin of the anal sphincter as well. On ultrasound the tumor extended through the wall of the rectum and multiple locations. There also appeared to be involvement of the anal sphincter musculature. This was felt to represent a T3 N0 tumor. The patient has undergone a CT scan of the chest abdomen and pelvis for further staging. No evidence of metastatic disease within the lungs. The patient was noted to have circumferential thickening of the anorectal junction with no  evidence of metastatic involvement of the abdomen or pelvis. No abnormal adjacent lymph nodes seen.  The patient is therefore presenting with locally advanced distal rectal adenocarcinoma and I been asked to see her today for consideration of radiotherapy. She is also seeing Dr. Truett Perna in medical oncology tomorrow.    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     PAST SURGICAL HISTORY: Past Surgical History  Procedure Laterality Date  . Breast enhancement surgery  2011  . Tummy tuck  2009  . Hernia repair      as an infant  . Examination under anesthesia N/A 05/13/2012    Procedure: EXAM UNDER ANESTHESIA;  Surgeon: Wilmon Arms. Corliss Skains, MD;  Location: [facility] OR;  Service: General;  Laterality: N/A;  . Hemorrhoid surgery N/A 05/13/2012    Procedure: HEMORRHOIDECTOMY;  Surgeon: Wilmon Arms. Corliss Skains, MD;  Location: [facility] OR;  Service: General;  Laterality: N/A;  biopsies of anal lesions  . Eus N/A 05/19/2012    Procedure: LOWER ENDOSCOPIC ULTRASOUND (EUS);  Surgeon: Willis Modena, MD;  Location: [facility];  Service: Endoscopy;  Laterality: N/A;  . Colonoscopy N/A 05/19/2012    Procedure: COLONOSCOPY;  Surgeon: Willis Modena, MD;  Location: [facility];  Service: Endoscopy;  Laterality: N/A;  . Resection of anus  05/13/2012    adenocarcinoma     FAMILY HISTORY: family history includes Other in her father.   SOCIAL HISTORY:  reports  that she has never smoked. She has never used smokeless tobacco. She reports that she drinks about 0.6 ounces of alcohol per week. She reports that she does not use illicit drugs.   ALLERGIES: Tetanus toxoids and Benadryl   MEDICATIONS:  Current Outpatient Prescriptions  Medication Sig Dispense Refill  . ALPRAZolam (XANAX) 0.5 MG tablet Take 0.5 mg by mouth as needed  (30 Minutes before she flies.).       Marland Kitchen Nutritional Supplements (JUICE PLUS FIBRE PO) Take by mouth.      Marland Kitchen OVER THE COUNTER MEDICATION Take 2 tablets by mouth 2 (two) times daily. Herblax stool softener      . oxyCODONE (OXYCONTIN) 10 MG 12 hr tablet Take 10 mg by mouth every 12 (twelve) hours.      Marland Kitchen oxyCODONE-acetaminophen (PERCOCET/ROXICET) 5-325 MG per tablet Take 1 tablet by mouth every 4 (four) hours as needed for pain.  40 tablet  0  . polyethylene glycol (MIRALAX / GLYCOLAX) packet Take 17 g by mouth daily.      . promethazine (PHENERGAN) 12.5 MG tablet Take 1 tablet (12.5 mg total) by mouth every 6 (six) hours as needed for nausea.  30 tablet  0   No current facility-administered medications for this encounter.     REVIEW OF SYSTEMS:  A 15 point review of systems is documented in the electronic medical record. This was obtained by the nursing staff. However, I reviewed this with the patient to discuss relevant findings and make appropriate changes.  Pertinent items are noted in HPI.    PHYSICAL EXAM:  weight is 110 lb 11.2 oz (50.213 kg). Her temperature is 98.5 F (36.9 C). Her blood pressure is 122/74 and her pulse is 70. Her oxygen saturation is 100%.   General: Well-developed, in no acute distress HEENT: Normocephalic, atraumatic; oral cavity clear Neck: Supple without any lymphadenopathy Cardiovascular: Regular rate and rhythm Respiratory: Clear to auscultation bilaterally GI: Soft, nontender, normal bowel sounds Extremities: No edema present Neuro: No focal deficits Rectal: No lesions visible external he. On digital rectal exam, and he narrowed sphincter was present with a distal rectal mass easily palpable. Name was present circumferentially but more bulky tumor was present on the left.    LABORATORY DATA:  Lab Results  Component Value Date   WBC 6.0 05/13/2012   HGB 13.8 05/13/2012   HCT 38.4 05/13/2012   MCV 88.1 05/13/2012   PLT 287 05/13/2012   Lab Results    Component Value Date   NA 137 05/13/2012   K 3.6 05/13/2012   CL 101 05/13/2012   CO2 24 05/13/2012   Lab Results  Component Value Date   ALT 8 05/13/2012   AST 18 05/13/2012   ALKPHOS 58 05/13/2012   BILITOT 0.4 05/13/2012      RADIOGRAPHY: Ct Chest W Contrast  05/20/2012  *RADIOLOGY REPORT*  Clinical Data:  Recent diagnosis of rectal carcinoma, for staging  CT CHEST, ABDOMEN AND PELVIS WITH CONTRAST  Technique:  Multidetector CT imaging of the chest, abdomen and pelvis was performed following the standard protocol during bolus administration of intravenous contrast.  Contrast: OMNIPAQUE IOHEXOL 300 MG/ML  SOLN,  Comparison:   None.  CT CHEST  Findings:  On the lung window images no lung parenchymal lesion is seen.  No effusion is noted.  No bony abnormality is seen.  On soft tissue window images, the thyroid gland is unremarkable. The thoracic aorta opacifies with no significant abnormality and the origins of the  great vessels appear patent.  The pulmonary arteries also opacify with no significant abnormality noted.  No mediastinal or hilar adenopathy is seen. No axillary adenopathy is seen.  Bilateral breast implants are noted.  IMPRESSION: No evidence of metastatic involvement of the lungs.  No adenopathy.  CT ABDOMEN AND PELVIS  Findings:  The liver enhances and there is no evidence of metastatic involvement.  The gallbladder is visualized with no calcified gallstones noted.  The pancreas is normal in size and the pancreatic duct is not dilated.  The adrenal glands and spleen are unremarkable.  The stomach is moderately fluid distended with no abnormality noted.  The kidneys enhance with no calculus or mass and no hydronephrosis is seen.  On delayed images, the pelvocaliceal systems are unremarkable the abdominal aorta is normal in caliber.  No adenopathy is seen.  The uterus is normal in size, slightly to the left of midline.  A small left ovarian cyst is noted of 2.3 cm in diameter.  No free  fluid is seen within the pelvis.  The urinary bladder is not well distended but is unremarkable.  There is some circumferential thickening of the anorectal mucosa which may represent the site of known rectal carcinoma.  The colon is not well distended but no other abnormality is evident.  The appendix coils anterior to the cecum and is unremarkable, as is the terminal ileum.  A few small groin nodes are present, none of which are pathologically enlarged. No bony abnormality is seen.  The thoracolumbar vertebrae are in normal alignment.  IMPRESSION:  1.  Circumferential thickening of the anorectal junction may represent the newly diagnosed  rectal carcinoma.  However, there is no evidence of metastatic involvement of the abdomen or pelvis and no abnormal adjacent lymph nodes are seen. 2.  2.3 cm left ovarian cyst.   Original Report Authenticated By: Dwyane Dee, M.D.    Ct Abdomen Pelvis W Contrast  05/20/2012  *RADIOLOGY REPORT*  Clinical Data:  Recent diagnosis of rectal carcinoma, for staging  CT CHEST, ABDOMEN AND PELVIS WITH CONTRAST  Technique:  Multidetector CT imaging of the chest, abdomen and pelvis was performed following the standard protocol during bolus administration of intravenous contrast.  Contrast: OMNIPAQUE IOHEXOL 300 MG/ML  SOLN,  Comparison:   None.  CT CHEST  Findings:  On the lung window images no lung parenchymal lesion is seen.  No effusion is noted.  No bony abnormality is seen.  On soft tissue window images, the thyroid gland is unremarkable. The thoracic aorta opacifies with no significant abnormality and the origins of the great vessels appear patent.  The pulmonary arteries also opacify with no significant abnormality noted.  No mediastinal or hilar adenopathy is seen. No axillary adenopathy is seen.  Bilateral breast implants are noted.  IMPRESSION: No evidence of metastatic involvement of the lungs.  No adenopathy.  CT ABDOMEN AND PELVIS  Findings:  The liver enhances and there  is no evidence of metastatic involvement.  The gallbladder is visualized with no calcified gallstones noted.  The pancreas is normal in size and the pancreatic duct is not dilated.  The adrenal glands and spleen are unremarkable.  The stomach is moderately fluid distended with no abnormality noted.  The kidneys enhance with no calculus or mass and no hydronephrosis is seen.  On delayed images, the pelvocaliceal systems are unremarkable the abdominal aorta is normal in caliber.  No adenopathy is seen.  The uterus is normal in size, slightly to the  left of midline.  A small left ovarian cyst is noted of 2.3 cm in diameter.  No free fluid is seen within the pelvis.  The urinary bladder is not well distended but is unremarkable.  There is some circumferential thickening of the anorectal mucosa which may represent the site of known rectal carcinoma.  The colon is not well distended but no other abnormality is evident.  The appendix coils anterior to the cecum and is unremarkable, as is the terminal ileum.  A few small groin nodes are present, none of which are pathologically enlarged. No bony abnormality is seen.  The thoracolumbar vertebrae are in normal alignment.  IMPRESSION:  1.  Circumferential thickening of the anorectal junction may represent the newly diagnosed  rectal carcinoma.  However, there is no evidence of metastatic involvement of the abdomen or pelvis and no abnormal adjacent lymph nodes are seen. 2.  2.3 cm left ovarian cyst.   Original Report Authenticated By: Dwyane Dee, M.D.        IMPRESSION: The patient is a pleasant 52 year old female who is presenting with an initial diagnosis of a T3, N0, M0 adenocarcinoma of the distal rectum. She is having significant pain at this time and this has been present for several months. She is now taking stronger pain medicine which is working better. Functionally, the patient appears to continue to have control of the sphincter. She notes typically several  loose stools per day. No rectal incontinence.    PLAN:  I believe that the patient is an appropriate candidate for a course of neoadjuvant chemoradiotherapy prior to definitive surgical resection. I described to the patient and her husband, as well as her close friend who accompanied her today, but would be entailed in a typical 5-1/2 week course of radiotherapy with concurrent chemotherapy. We discussed in detail the potential side effects and risks of treatment. We also had a detailed discussion regarding the rationale of this treatment. All of the patient's questions were answered.   The patient is seeing medical oncology tomorrow. She is also seeing Dr.  Corliss Skains later this week and this is currently scheduled on Friday. He raised the possibility of performing a diversion at this time for pain management purposes followed then by definitive treatment. The patient does appear to be having a significant amount of pain which has continued although this also appears to be much improved compared to what this was prior to her current pain medication regimen. We discussed the typical timeframe of potential improvement in her symptoms with chemoradiotherapy as well as the logistics of beginning this treatment. She wants to further think about this issue before making a final decision and she would like to further discuss this with Dr.  Corliss Skains as well. I have therefore held off on scheduling a simulation at this time although she is to let us know later this week what she would like to do terms of either proceeding initially with chemoradiotherapy or performing a diversion initially.     the major issue in her case I believe, which I discussed in detail with her, is the low nature of her tumor and the ultrasound was concerning for possible sphincter musculature or involvement. Whether or not the patient will need a permanent colostomy will therefore be 1 of the dominant issues for her care. I also discuss with her  that based on our workup thus far, this does appear to be a curable tumor with only local tumor known currently.   I spent 80  minutes minutes face to face with the patient and more than 50% of that time was spent in counseling and/or coordination of care.    ________________________________   Radene Gunning, MD, PhD

## 2012-05-25 ENCOUNTER — Ambulatory Visit (HOSPITAL_BASED_OUTPATIENT_CLINIC_OR_DEPARTMENT_OTHER): Payer: BC Managed Care – PPO | Admitting: Oncology

## 2012-05-25 ENCOUNTER — Telehealth: Payer: Self-pay | Admitting: Oncology

## 2012-05-25 ENCOUNTER — Ambulatory Visit: Payer: BC Managed Care – PPO

## 2012-05-25 VITALS — BP 112/67 | HR 73 | Temp 98.3°F | Resp 20 | Ht 63.0 in | Wt 110.8 lb

## 2012-05-25 DIAGNOSIS — K6289 Other specified diseases of anus and rectum: Secondary | ICD-10-CM

## 2012-05-25 DIAGNOSIS — G4700 Insomnia, unspecified: Secondary | ICD-10-CM

## 2012-05-25 DIAGNOSIS — R110 Nausea: Secondary | ICD-10-CM

## 2012-05-25 MED ORDER — LORAZEPAM 0.5 MG PO TABS: 0.5000 mg | ORAL_TABLET | Freq: Every evening | ORAL | Status: DC | PRN

## 2012-05-25 MED ORDER — ONDANSETRON HCL 8 MG PO TABS: 8.0000 mg | ORAL_TABLET | Freq: Three times a day (TID) | ORAL | Status: DC | PRN

## 2012-05-25 NOTE — Progress Notes (Signed)
Met with patient and explained role of nurse navigator.  Educational information on colorectal cancer provided along with resource names and contact phone numbers.  She verbalized comprehension and was without questions.  Referral made to dietician.

## 2012-05-25 NOTE — Progress Notes (Signed)
[facility] New Patient Consult   Referring MD: Eleny Cortez 52 y.o.  [birth year]    Reason for Referral: Rectal cancer     HPI: She reports developing "hemorrhoids "in the summer of 2013. In late October of 2013 she noticed decreased stool frequency and at Thanksgiving she developed constipation/rectal pain. She was referred to Dr. Ewing Schlein and was felt to potentially have an anal fissure. Her symptoms did not respond to treatment with  nitroglycerin and ointments. She was referred to Dr. Corliss Skains on 05/11/2012. Physical exam revealed external hemorrhoids with firmness/tenderness adjacent to a left external hemorrhoid that appeared thrombosed. There appeared to be an anterior external hemorrhoid with an adjacent fissure.  An examination under anesthesia was recommended. A firm mass was noted anteriorly that extended to the left side and was suspicious for malignancy. This was biopsied.  The pathology ([id number]) confirmed adenocarcinoma with lymphovascular invasion involving the anterior biopsy. The left lateral biopsy also revealed adenocarcinoma with lymphovascular invasion. Biopsy of a "external hemorrhoid "revealed adenocarcinoma with lymphovascular invasion.  She was referred to Dr. Dulce Sellar and was taken to a colonoscopy and endorectal ultrasound procedure on 05/19/2012. The tumor was noted to be completely circumferential and extended from 5 cm proximal to the anal verge to the dentate line. Visible ulcerated tissue was noted on the external margin of the anal sphincter. No other polyps or masses were noted. On ultrasound evaluation the tumor was noted to extend from the distal for-5 cm of the rectum to the anal verge. No adenopathy was appreciated. Tumor extended through the wall of the rectum in many locations. There appeared to be involvement of the anal sphincter musculature.   She was referred for staging CT scans on 05/20/2012. No evidence of metastatic  involvement of the lungs. No evidence of liver metastases. No abdominal adenopathy the small left ovarian cyst. Circumferential thickening was noted at the anorectal mucosa. A few nonpathologic groin nodes were noted.  She saw Dr. Mitzi Hansen yesterday to discuss neoadjuvant radiation. She reports improvement in the rectal pain since she began OxyContin.  Past Medical History  Diagnosis Date  . Allergy     seasonal  . Ovarian cyst   . Migraine     manages with advil migreaine , sleep and dark room.  .    . PONV (postoperative nausea and vomiting)   . 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  G7, P6-one miscarriage, she continues to have a menstrual cycle      Past Surgical History  Procedure Laterality Date  . Breast enhancement surgery  2011  . Tummy tuck  2009  . Hernia repair      as an infant  . Examination under anesthesia N/A 05/13/2012    Procedure: EXAM UNDER ANESTHESIA;  Surgeon: Wilmon Arms. Corliss Skains, MD;  Location: [facility] OR;  Service: General;  Laterality: N/A;  . Hemorrhoid surgery N/A 05/13/2012    Procedure: HEMORRHOIDECTOMY;  Surgeon: Wilmon Arms. Corliss Skains, MD;  Location: [facility] OR;  Service: General;  Laterality: N/A;  biopsies of anal lesions  . Eus N/A 05/19/2012    Procedure: LOWER ENDOSCOPIC ULTRASOUND (EUS);  Surgeon: Willis Modena, MD;  Location: [facility];  Service: Endoscopy;  Laterality: N/A;  . Colonoscopy N/A 05/19/2012    Procedure: COLONOSCOPY;  Surgeon: Willis Modena, MD;  Location: [facility];  Service: Endoscopy;  Laterality: N/A;  .           Family History  Problem Relation  Age of Onset  . Other Father     amyloidosis        prostate cancer                                 paternal uncle  Current outpatient prescriptions:ALPRAZolam (XANAX) 0.5 MG tablet, Take 0.5 mg by mouth as needed (30 Minutes before she flies.). , Disp: , Rfl: ;  LORazepam (ATIVAN) 0.5 MG tablet, Take 1-2 tablets (0.5-1 mg total) by mouth at bedtime as needed for anxiety., Disp: 60 tablet,  Rfl: 0;  Nutritional Supplements (JUICE PLUS FIBRE PO), Take by mouth., Disp: , Rfl:  ondansetron (ZOFRAN) 8 MG tablet, Take 1 tablet (8 mg total) by mouth every 8 (eight) hours as needed for nausea., Disp: 30 tablet, Rfl: 0;  OVER THE COUNTER MEDICATION, Take 2 tablets by mouth 2 (two) times daily. Herblax stool softener, Disp: , Rfl: ;  oxyCODONE (OXYCONTIN) 10 MG 12 hr tablet, Take 10 mg by mouth every 12 (twelve) hours., Disp: , Rfl:  oxyCODONE-acetaminophen (PERCOCET/ROXICET) 5-325 MG per tablet, Take 1 tablet by mouth every 4 (four) hours as needed for pain., Disp: 40 tablet, Rfl: 0;  polyethylene glycol (MIRALAX / GLYCOLAX) packet, Take 17 g by mouth daily., Disp: , Rfl: ;  promethazine (PHENERGAN) 12.5 MG tablet, Take 1 tablet (12.5 mg total) by mouth every 6 (six) hours as needed for nausea., Disp: 30 tablet, Rfl: 0  Allergies:  Allergies  Allergen Reactions  . Tetanus Toxoids Other (See Comments)    Got extremely ill for days  . Benadryl (Diphenhydramine Hcl) Palpitations    Social History: She lives with her husband in [place]. She is a high school Editor, commissioning. She does not use cigarettes. She reports social alcohol use. No transfusion history. No risk factor for HIV or hepatitis.  ROS:   Positives include: Intermittent nausea, 5 pound weight loss, anorexia, pain at the rectum, bleeding when she wipes following a bowel movement, intermittent headache, transient discomfort in the left pelvis, pruritus after a cleansing material was used for the examination under anesthesia, insomnia  A complete ROS was otherwise negative.  Physical Exam:  Blood pressure 112/67, pulse 73, temperature 98.3 F (36.8 C), temperature source Oral, resp. rate 20, height 5\' 3"  (1.6 m), weight 110 lb 12.8 oz (50.259 kg), last menstrual period 05/13/2012.  HEENT: Oropharynx without visible mass, neck without mass Lungs: Clear bilaterally Cardiac: Regular rate and rhythm Abdomen: No  hepatosplenomegaly, nontender, no mass  Lymph nodes: No cervical, supraclavicular, or axillary nodes. 2 firm mobile 1/2 cm left inguinal nodes, pea sized firm right inguinal node (she reports these nodes have been present for years) Vascular: No leg edema Neurologic: Alert and oriented, the motor exam appears intact in the upper and lower extremities. Skin: Mild erythematous rash at the low lateral back bilaterally    LAB:  CBC  Lab Results  Component Value Date   WBC 6.0 05/13/2012   HGB 13.8 05/13/2012   HCT 38.4 05/13/2012   MCV 88.1 05/13/2012   PLT 287 05/13/2012     CMP      Component Value Date/Time   NA 137 05/13/2012 1407   K 3.6 05/13/2012 1407   CL 101 05/13/2012 1407   CO2 24 05/13/2012 1407   GLUCOSE 121* 05/13/2012 1407   BUN 9 05/13/2012 1407   CREATININE 0.72 05/13/2012 1407   CALCIUM 9.2 05/13/2012 1407   PROT 7.3  05/13/2012 1407   ALBUMIN 3.8 05/13/2012 1407   AST 18 05/13/2012 1407   ALT 8 05/13/2012 1407   ALKPHOS 58 05/13/2012 1407   BILITOT 0.4 05/13/2012 1407   GFRNONAA >90 05/13/2012 1407   GFRAA >90 05/13/2012 1407   CEA on 05/13/2012-1.0   Radiology: As per history of present illness    Assessment/Plan:   1. Rectal cancer-low rectal cancer with probable involvement of the anal margin/sphincter musculature, status post a surgical biopsy 05/13/2012 confirming adenocarcinoma with lymphovascular invasion,[id number]  2. Pain secondary to #1  3. Intermittent nausea-? Secondary to polypharmacy    Disposition:   She has been diagnosed with  locally advanced rectal cancer. I discussed the diagnosis and treatment options with Ms. Bradt and her husband. We discussed the low nature of the tumor that may require a permanent colostomy. I recommend neoadjuvant capecitabine and radiation to be followed by definitive surgery. There is no evidence of distant metastatic disease based on the staging evaluation to date.  She would like to obtain another surgical opinion.  She plans to discuss this with Dr. Corliss Skains later today. She may also request another medical oncology/radiation oncology opinion.   I will coordinate neoadjuvant therapy with Dr. Mitzi Hansen if she decides to proceed with treatment here. We discussed treatment with capecitabine and radiation. I specifically reviewed the potential toxicities associated with capecitabine including the chance for mucositis, diarrhea, and hematologic toxicity. We discussed the skin rash, hyperpigmentation, and hand/foot syndrome associated with capecitabine. We discussed the potential for skin breakdown with capecitabine and radiation.  She plans to continue the current narcotic regimen for relief of the rectal pain. She continues a laxative program. We prescribed Ativan to use as needed for insomnia and Zofran for nausea.  She has been scheduled for a chemotherapy teaching class. We plan to initiate concurrent capecitabine and radiation within the next 1- 2 weeks if she is in agreement. Her case will be presented at the GI tumor conference on 05/26/2012.  SHERRILL, GARY 05/25/2012, 5:21 PM

## 2012-05-26 ENCOUNTER — Encounter (INDEPENDENT_AMBULATORY_CARE_PROVIDER_SITE_OTHER): Payer: Self-pay | Admitting: Surgery

## 2012-05-26 ENCOUNTER — Ambulatory Visit (INDEPENDENT_AMBULATORY_CARE_PROVIDER_SITE_OTHER): Payer: 59 | Admitting: Surgery

## 2012-05-26 VITALS — BP 118/82 | HR 72 | Temp 97.4°F | Resp 18 | Ht 63.5 in | Wt 111.5 lb

## 2012-05-26 DIAGNOSIS — C20 Malignant neoplasm of rectum: Secondary | ICD-10-CM

## 2012-05-26 MED ORDER — LIDOCAINE HCL 2 % EX GEL: CUTANEOUS | Status: DC

## 2012-05-26 MED ORDER — OXYCODONE-ACETAMINOPHEN 5-325 MG PO TABS: 1.0000 | ORAL_TABLET | ORAL | Status: DC | PRN

## 2012-05-26 MED ORDER — OXYCODONE HCL 10 MG PO TB12: 10.0000 mg | ORAL_TABLET | Freq: Two times a day (BID) | ORAL | Status: DC

## 2012-05-26 NOTE — Progress Notes (Signed)
Post-operative visit   The patient returns today for a postop visit after examination under anesthesia and biopsy of rectal mass on 05/13/12. Unfortunately the pathology showed adenocarcinoma. The patient has had extensive staging workup over the last 2 weeks. CEA level was normal at 1.0. CT scan of the chest, abdomen, and pelvis was unremarkable for any lymphadenopathy or metastatic disease. Colonoscopy showed no other suspicious areas in the colon. Endorectal ultrasound showed a T3 lesion with invasion through the rectal wall with probable involvement of the sphincter muscle.  She has had consultations with Dr. Mitzi Hansen of radiation oncology and Dr.Sherrill of medical oncology. She and her family are requesting a second opinion and I have arranged a consultation with Dr. Mirian Mo at [facility]. He has agreed to see her tomorrow morning. All of her records have been sent to his office.  The patient continues to have significant rectal pain with bowel movements. However she states that her pain is manageable with the current regimen of OxyContin 10 mg by mouth every 12 with only occasional when necessary Percocet pills. She is using frequent sitz baths. She also uses occasional lidocaine jelly. She is using MiraLAX to keep her bowel movements soft. 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  Filed Vitals:   05/26/12 1437  BP: 118/82  Pulse: 72  Temp: 97.4 F (36.3 C)  Resp: 18   Well developed well nourished in no apparent distress Rectal examination shows an open wound in the left lateral location. This is fairly small with minimal exudate. Minimal swelling around the incision. She is fairly tender to palpation in this area as would be expected. The anterior biopsy site is also visible and is clean with no significant swelling.  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 Our current  recommendations are to proceed with neoadjuvant chemotherapy radiation. It remains highly likely that the patient will need subsequent abdominoperineal resection due to involvement of her sphincter muscles.She understands the possibility of needing a permanent colostomy. 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 They have requested a second opinion and I have referred her to Dr. Mirian Mo in colorectal surgery at [facility]. They have an appointment for tomorrow. We will see the patient back in 2-3 weeks to recheck her incision.  Wilmon Arms. Corliss Skains, MD, [facility] Surgery  05/26/2012 4:05 PM

## 2012-05-27 ENCOUNTER — Ambulatory Visit
Admission: RE | Admit: 2012-05-27 | Discharge: 2012-05-27 | Disposition: A | Discharge status: Home / Self Care | Payer: BC Managed Care – PPO | Source: Ambulatory Visit | Attending: Radiation Oncology | Admitting: Radiation Oncology

## 2012-05-27 ENCOUNTER — Encounter (INDEPENDENT_AMBULATORY_CARE_PROVIDER_SITE_OTHER): Payer: Self-pay

## 2012-05-27 DIAGNOSIS — R110 Nausea: Secondary | ICD-10-CM | POA: Insufficient documentation

## 2012-05-27 DIAGNOSIS — R5381 Other malaise: Secondary | ICD-10-CM | POA: Insufficient documentation

## 2012-05-27 DIAGNOSIS — C20 Malignant neoplasm of rectum: Secondary | ICD-10-CM | POA: Insufficient documentation

## 2012-05-27 DIAGNOSIS — Z510 Encounter for antineoplastic radiation therapy: Secondary | ICD-10-CM | POA: Insufficient documentation

## 2012-05-27 DIAGNOSIS — G253 Myoclonus: Secondary | ICD-10-CM | POA: Insufficient documentation

## 2012-05-27 DIAGNOSIS — R42 Dizziness and giddiness: Secondary | ICD-10-CM | POA: Insufficient documentation

## 2012-05-27 DIAGNOSIS — Z79899 Other long term (current) drug therapy: Secondary | ICD-10-CM | POA: Insufficient documentation

## 2012-05-27 DIAGNOSIS — Y842 Radiological procedure and radiotherapy as the cause of abnormal reaction of the patient, or of later complication, without mention of misadventure at the time of the procedure: Secondary | ICD-10-CM | POA: Insufficient documentation

## 2012-05-27 DIAGNOSIS — L293 Anogenital pruritus, unspecified: Secondary | ICD-10-CM | POA: Insufficient documentation

## 2012-05-27 DIAGNOSIS — R630 Anorexia: Secondary | ICD-10-CM | POA: Insufficient documentation

## 2012-05-27 NOTE — Progress Notes (Addendum)
  Radiation Oncology         [phone]) [phone] ________________________________  Name: Leah Le MRN: [id number]  Date: 05/27/2012  DOB: 10/19/1960  SIMULATION AND TREATMENT PLANNING NOTE   CONSENT VERIFIED: yes   SET UP: Patient is set-up supine   IMMOBILIZATION: The following immobilization is used: alpha-cradle. This complex treatment device will be used on a daily basis during the patient's treatment.   Diagnosis: rectal cancer   NARRATIVE: The patient was brought to the CT Simulation planning suite. Identity was confirmed. All relevant records and images related to the planned course of therapy were reviewed. Then, the patient was positioned in a stable reproducible clinical set-up for radiation therapy using an aquaplast mask. Skin markings were placed. The CT images were loaded into the planning software where the target and avoidance structures were contoured.The radiation prescription was entered and confirmed.   The patient will receive 54 Gy in 30 fractions to the high-dose target region.  Daily image guidance is ordered, and this will be used on a daily basis. This is necessary to ensure accurate and precise localization of the target in addition to accurate alignment of the normal tissue structures in this region. This is particularly important given the possible motion of the high-dose target.  Treatment planning then occurred.   I have requested : Intensity Modulated Radiotherapy (IMRT) is medically necessary for this case for the following reason: Dose homogeneity; the target is in close proximity to critical normal structures, including the femoral heads, bladder, and small bowel. The patient's cancer extends to include lymph nodes in the retroperitoneal region as well as the inguinal region. Therefore, the patient will require treatment in the manner that patients with anal cancer are treated. IMRT is thus medically necessary to appropriately treat the patient.    Special treatment procedure  The patient will receive chemotherapy during the course of radiation treatment. The patient may experience increased or overlapping toxicity due to this combined-modality approach and the patient will be monitored for such problems. This may include extra lab work as necessary. This therefore constitutes a special treatment procedure.     ________________________________  Radene Gunning, MD, PhD

## 2012-05-28 ENCOUNTER — Telehealth (INDEPENDENT_AMBULATORY_CARE_PROVIDER_SITE_OTHER): Payer: Self-pay | Admitting: *Deleted

## 2012-05-28 ENCOUNTER — Encounter (INDEPENDENT_AMBULATORY_CARE_PROVIDER_SITE_OTHER): Payer: 59 | Admitting: Surgery

## 2012-05-28 ENCOUNTER — Encounter (INDEPENDENT_AMBULATORY_CARE_PROVIDER_SITE_OTHER): Payer: Self-pay | Admitting: *Deleted

## 2012-05-28 ENCOUNTER — Telehealth: Payer: Self-pay | Admitting: *Deleted

## 2012-05-28 NOTE — Telephone Encounter (Signed)
Patient called to request a revision of her letter for work.  Patient needed letter to state first time she had seen Dr. Corliss Skains.  Letter was completed and faxed to [phone]. Attn: Mervyn Skeeters.  Also this RN verified that prescription for Lidocaine Jelly has been received and ready for patient to pick up.

## 2012-05-28 NOTE — Telephone Encounter (Signed)
CALLED PATIENT TO INFORM OF TEST, SPOKE WITH PATIENT AND SHE IS AWARE OF TEST

## 2012-05-31 ENCOUNTER — Other Ambulatory Visit: Payer: Self-pay | Admitting: *Deleted

## 2012-05-31 ENCOUNTER — Other Ambulatory Visit: Payer: BC Managed Care – PPO

## 2012-05-31 ENCOUNTER — Encounter: Payer: Self-pay | Admitting: Oncology

## 2012-05-31 MED ORDER — CAPECITABINE 500 MG PO TABS: ORAL_TABLET | ORAL | Status: DC

## 2012-05-31 NOTE — Progress Notes (Signed)
Faxed xeloda prescription to [facility] °

## 2012-06-01 ENCOUNTER — Encounter (HOSPITAL_COMMUNITY)
Admission: RE | Admit: 2012-06-01 | Discharge: 2012-06-01 | Disposition: A | Discharge status: Home / Self Care | Payer: BC Managed Care – PPO | Source: Ambulatory Visit | Attending: Radiation Oncology | Admitting: Radiation Oncology

## 2012-06-01 ENCOUNTER — Ambulatory Visit: Payer: BC Managed Care – PPO | Admitting: Lab

## 2012-06-01 DIAGNOSIS — C774 Secondary and unspecified malignant neoplasm of inguinal and lower limb lymph nodes: Secondary | ICD-10-CM | POA: Insufficient documentation

## 2012-06-01 DIAGNOSIS — C775 Secondary and unspecified malignant neoplasm of intrapelvic lymph nodes: Secondary | ICD-10-CM | POA: Insufficient documentation

## 2012-06-01 DIAGNOSIS — C20 Malignant neoplasm of rectum: Secondary | ICD-10-CM | POA: Insufficient documentation

## 2012-06-01 DIAGNOSIS — C772 Secondary and unspecified malignant neoplasm of intra-abdominal lymph nodes: Secondary | ICD-10-CM | POA: Insufficient documentation

## 2012-06-01 MED ORDER — FLUDEOXYGLUCOSE F - 18 (FDG) INJECTION
15.8000 | Freq: Once | INTRAVENOUS | Status: AC | PRN
  Administered 2012-06-01: 15.8 via INTRAVENOUS

## 2012-06-01 NOTE — Progress Notes (Signed)
Two purple top tubes were drawn in [facility] lab from patient and delivered to Javata Epps in Pathology.  She is going to send the blood via courier to [facility] attention: Dr. Frederica Kuster.  Per Lonia Farber, Dr. Frederica Kuster is aware of the blood being sent to him for MSI testing.  Dr. Frederica Kuster spoke with Dr. Truett Perna earlier today requesting this blood work since there was not enough tissue from the biopsy to perform MSI testing.  Patient was made aware and agreeable to blood work.

## 2012-06-02 ENCOUNTER — Telehealth: Payer: Self-pay | Admitting: *Deleted

## 2012-06-02 ENCOUNTER — Ambulatory Visit: Payer: BC Managed Care – PPO | Admitting: Radiation Oncology

## 2012-06-02 MED ORDER — TEMAZEPAM 15 MG PO CAPS: 15.0000 mg | ORAL_CAPSULE | Freq: Every evening | ORAL | Status: DC | PRN

## 2012-06-02 NOTE — Progress Notes (Signed)
Rec'd fax confirmation that Xeloda Rx shipped on 06/01/12 for next day delivery.

## 2012-06-02 NOTE — Telephone Encounter (Signed)
Needs sleep medication. Reports Ativan causes itching and reports allergy to Benadryl. Xanax not effective according to patient. Will try Restoril 15 mg at bedtime per [name], NP.

## 2012-06-02 NOTE — Telephone Encounter (Signed)
Spoke with patient per request of Dr. Truett Perna.  She understands to hold off on taking her Xeloda until she hears from medical and radiation oncologists about treatment plan and what the exact plan will be.  Dr. Truett Perna spoke with patient yesterday and gave patient results of PET scan.  He spoke with Dr. Michell Heinrich (Dr. Mitzi Hansen out of town).  Patient was informed today by this RN that Dr. Michell Heinrich is waiting to talk with Dr. Mitzi Hansen re: treatment.  Patient was informed that she should hear something from radiation oncologist by Friday, March 21st.  Patient complained she is not able to sleep and stated the Ativan is making her itch.  She denies rash, fever, or other symptoms. Dr. Kalman Drape nurse, Darl Pikes, informed and  [name] NP to prescribe something else for sleep.

## 2012-06-03 ENCOUNTER — Ambulatory Visit: Payer: BC Managed Care – PPO

## 2012-06-03 ENCOUNTER — Encounter: Payer: Self-pay | Admitting: *Deleted

## 2012-06-03 ENCOUNTER — Ambulatory Visit
Admission: RE | Admit: 2012-06-03 | Discharge: 2012-06-03 | Disposition: A | Discharge status: Home / Self Care | Payer: BC Managed Care – PPO | Source: Ambulatory Visit | Attending: Radiation Oncology | Admitting: Radiation Oncology

## 2012-06-03 DIAGNOSIS — C20 Malignant neoplasm of rectum: Secondary | ICD-10-CM

## 2012-06-03 MED ORDER — SODIUM CHLORIDE 0.9 % IJ SOLN
10.0000 mL | Freq: Once | INTRAMUSCULAR | Status: AC
  Administered 2012-06-03: 10 mL via INTRAVENOUS

## 2012-06-03 NOTE — Addendum Note (Signed)
Encounter addended by: Lowella Petties, RN on: 06/03/2012  8:10 AM<BR>     Documentation filed: Charges VN

## 2012-06-04 ENCOUNTER — Ambulatory Visit: Payer: BC Managed Care – PPO

## 2012-06-07 ENCOUNTER — Ambulatory Visit: Payer: BC Managed Care – PPO

## 2012-06-08 ENCOUNTER — Ambulatory Visit: Payer: BC Managed Care – PPO

## 2012-06-09 ENCOUNTER — Ambulatory Visit: Payer: BC Managed Care – PPO

## 2012-06-09 ENCOUNTER — Ambulatory Visit: Payer: BC Managed Care – PPO | Admitting: Radiation Oncology

## 2012-06-09 ENCOUNTER — Ambulatory Visit
Admission: RE | Admit: 2012-06-09 | Discharge: 2012-06-09 | Disposition: A | Discharge status: Home / Self Care | Payer: BC Managed Care – PPO | Source: Ambulatory Visit | Attending: Radiation Oncology | Admitting: Radiation Oncology

## 2012-06-10 ENCOUNTER — Ambulatory Visit (HOSPITAL_BASED_OUTPATIENT_CLINIC_OR_DEPARTMENT_OTHER): Payer: BC Managed Care – PPO

## 2012-06-10 ENCOUNTER — Ambulatory Visit
Admission: RE | Admit: 2012-06-10 | Discharge: 2012-06-10 | Disposition: A | Discharge status: Home / Self Care | Payer: BC Managed Care – PPO | Source: Ambulatory Visit | Attending: Radiation Oncology | Admitting: Radiation Oncology

## 2012-06-10 ENCOUNTER — Ambulatory Visit: Payer: BC Managed Care – PPO

## 2012-06-10 ENCOUNTER — Telehealth: Payer: Self-pay | Admitting: *Deleted

## 2012-06-10 ENCOUNTER — Other Ambulatory Visit: Payer: Self-pay | Admitting: *Deleted

## 2012-06-10 VITALS — BP 105/71 | HR 101 | Temp 97.9°F | Resp 17

## 2012-06-10 DIAGNOSIS — C20 Malignant neoplasm of rectum: Secondary | ICD-10-CM

## 2012-06-10 DIAGNOSIS — R112 Nausea with vomiting, unspecified: Secondary | ICD-10-CM

## 2012-06-10 MED ORDER — SODIUM CHLORIDE 0.9 % IV SOLN
Freq: Once | INTRAVENOUS | Status: DC
  Administered 2012-06-10: 16:00:00 via INTRAVENOUS

## 2012-06-10 MED ORDER — ONDANSETRON 8 MG/50ML IVPB (CHCC)
8.0000 mg | Freq: Once | INTRAVENOUS | Status: AC
  Administered 2012-06-10: 8 mg via INTRAVENOUS

## 2012-06-10 NOTE — Telephone Encounter (Signed)
Received voice message from patient's mother stating Leah Le was having n/v and unable to eat or drink.  She stated the on-call doctor called in Compazine for her during the night, but that it was not helping her daughter at all.  She is not sure she can even come in for XRT today.  This message was given to Dr. Kalman Drape nurse, Archie Patten.

## 2012-06-10 NOTE — Patient Instructions (Addendum)
Dehydration, Adult Dehydration means your body does not have as much fluid as it needs. Your kidneys, brain, and heart will not work properly without the right amount of fluids and salt.  HOME CARE  Ask your doctor how to replace body fluid losses (rehydrate).  Drink enough fluids to keep your pee (urine) clear or pale yellow.  Drink small amounts of fluids often if you feel sick to your stomach (nauseous) or throw up (vomit).  Eat like you normally do.  Avoid:  Foods or drinks high in sugar.  Bubbly (carbonated) drinks.  Juice.  Very hot or cold fluids.  Drinks with caffeine.  Fatty, greasy foods.  Alcohol.  Tobacco.  Eating too much.  Gelatin desserts.  Wash your hands to avoid spreading germs (bacteria, viruses).  Only take medicine as told by your doctor.  Keep all doctor visits as told. GET HELP RIGHT AWAY IF:   You cannot drink something without throwing up.  You get worse even with treatment.  Your vomit has blood in it or looks greenish.  Your poop (stool) has blood in it or looks black and tarry.  You have not peed in 6 to 8 hours.  You pee a small amount of very dark pee.  You have a fever.  You pass out (faint).  You have belly (abdominal) pain that gets worse or stays in one spot (localizes).  You have a rash, stiff neck, or bad headache.  You get easily annoyed, sleepy, or are hard to wake up.  You feel weak, dizzy, or very thirsty. MAKE SURE YOU:   Understand these instructions.  Will watch your condition.  Will get help right away if you are not doing well or get worse. Document Released: 12/28/2008 Document Revised: 05/26/2011 Document Reviewed: 10/21/2010 [facility] Patient Information 2013 [place], [place].  Nausea and Vomiting Nausea is a sick feeling that often comes before throwing up (vomiting). Vomiting is a reflex where stomach contents come out of your mouth. Vomiting can cause severe loss of body fluids (dehydration).  Children and elderly adults can become dehydrated quickly, especially if they also have diarrhea. Nausea and vomiting are symptoms of a condition or disease. It is important to find the cause of your symptoms. CAUSES   Direct irritation of the stomach lining. This irritation can result from increased acid production (gastroesophageal reflux disease), infection, food poisoning, taking certain medicines (such as nonsteroidal anti-inflammatory drugs), alcohol use, or tobacco use.  Signals from the brain.These signals could be caused by a headache, heat exposure, an inner ear disturbance, increased pressure in the brain from injury, infection, a tumor, or a concussion, pain, emotional stimulus, or metabolic problems.  An obstruction in the gastrointestinal tract (bowel obstruction).  Illnesses such as diabetes, hepatitis, gallbladder problems, appendicitis, kidney problems, cancer, sepsis, atypical symptoms of a heart attack, or eating disorders.  Medical treatments such as chemotherapy and radiation.  Receiving medicine that makes you sleep (general anesthetic) during surgery. DIAGNOSIS Your caregiver may ask for tests to be done if the problems do not improve after a few days. Tests may also be done if symptoms are severe or if the reason for the nausea and vomiting is not clear. Tests may include:  Urine tests.  Blood tests.  Stool tests.  Cultures (to look for evidence of infection).  X-rays or other imaging studies. Test results can help your caregiver make decisions about treatment or the need for additional tests. TREATMENT You need to stay well hydrated. Drink frequently but  in small amounts.You may wish to drink water, sports drinks, clear broth, or eat frozen ice pops or gelatin dessert to help stay hydrated.When you eat, eating slowly may help prevent nausea.There are also some antinausea medicines that may help prevent nausea. HOME CARE INSTRUCTIONS   Take all medicine as  directed by your caregiver.  If you do not have an appetite, do not force yourself to eat. However, you must continue to drink fluids.  If you have an appetite, eat a normal diet unless your caregiver tells you differently.  Eat a variety of complex carbohydrates (rice, wheat, potatoes, bread), lean meats, yogurt, fruits, and vegetables.  Avoid high-fat foods because they are more difficult to digest.  Drink enough water and fluids to keep your urine clear or pale yellow.  If you are dehydrated, ask your caregiver for specific rehydration instructions. Signs of dehydration may include:  Severe thirst.  Dry lips and mouth.  Dizziness.  Dark urine.  Decreasing urine frequency and amount.  Confusion.  Rapid breathing or pulse. SEEK IMMEDIATE MEDICAL CARE IF:   You have blood or brown flecks (like coffee grounds) in your vomit.  You have black or bloody stools.  You have a severe headache or stiff neck.  You are confused.  You have severe abdominal pain.  You have chest pain or trouble breathing.  You do not urinate at least once every 8 hours.  You develop cold or clammy skin.  You continue to vomit for longer than 24 to 48 hours.  You have a fever. MAKE SURE YOU:   Understand these instructions.  Will watch your condition.  Will get help right away if you are not doing well or get worse. Document Released: 03/03/2005 Document Revised: 05/26/2011 Document Reviewed: 07/31/2010 [facility] Patient Information 2013 [place], [place].

## 2012-06-10 NOTE — Telephone Encounter (Signed)
Spoke with pt's mother. Pt did not feel well enough to come to the phone. She is having N/V. 4 episodes of emesis yesterday, Compazine Phenergan and Zofran ineffective. Pt was unable to take PM dose of Xeloda 3/26. Has not taken it this morning. Nausea this morning unable to tolerate fluids. Instructed her to take Lorazepam sublingual now. Will arrange for IV Fluids this afternoon. Instructions given to have pt take Zofran 1 hour prior to all Xeloda doses. Pt's mother voiced understanding. They will call office to cancel if nausea resolves with Ativan.

## 2012-06-10 NOTE — Telephone Encounter (Signed)
Called pt's mother with 3 PM appt for IVFluids.

## 2012-06-10 NOTE — Telephone Encounter (Signed)
Per patient request, nutrition appointment with [name] was re-scheduled to 06/16/12.

## 2012-06-11 ENCOUNTER — Ambulatory Visit: Payer: BC Managed Care – PPO

## 2012-06-11 ENCOUNTER — Other Ambulatory Visit (HOSPITAL_BASED_OUTPATIENT_CLINIC_OR_DEPARTMENT_OTHER): Payer: BC Managed Care – PPO | Admitting: Lab

## 2012-06-11 ENCOUNTER — Encounter: Payer: BC Managed Care – PPO | Admitting: Nutrition

## 2012-06-11 ENCOUNTER — Ambulatory Visit
Admission: RE | Admit: 2012-06-11 | Discharge: 2012-06-11 | Disposition: A | Discharge status: Home / Self Care | Payer: BC Managed Care – PPO | Source: Ambulatory Visit | Attending: Radiation Oncology | Admitting: Radiation Oncology

## 2012-06-11 ENCOUNTER — Ambulatory Visit (HOSPITAL_BASED_OUTPATIENT_CLINIC_OR_DEPARTMENT_OTHER): Payer: BC Managed Care – PPO | Admitting: Oncology

## 2012-06-11 ENCOUNTER — Telehealth: Payer: Self-pay | Admitting: Oncology

## 2012-06-11 ENCOUNTER — Telehealth: Payer: Self-pay | Admitting: *Deleted

## 2012-06-11 ENCOUNTER — Telehealth: Payer: Self-pay | Admitting: Pharmacist

## 2012-06-11 VITALS — BP 116/76 | HR 68 | Temp 98.0°F | Wt 108.4 lb

## 2012-06-11 VITALS — BP 109/73 | HR 82 | Temp 97.3°F | Resp 18 | Ht 63.5 in | Wt 108.7 lb

## 2012-06-11 DIAGNOSIS — C20 Malignant neoplasm of rectum: Secondary | ICD-10-CM

## 2012-06-11 DIAGNOSIS — G893 Neoplasm related pain (acute) (chronic): Secondary | ICD-10-CM

## 2012-06-11 DIAGNOSIS — R599 Enlarged lymph nodes, unspecified: Secondary | ICD-10-CM

## 2012-06-11 DIAGNOSIS — R110 Nausea: Secondary | ICD-10-CM

## 2012-06-11 LAB — CBC WITH DIFFERENTIAL/PLATELET
Eosinophils Absolute: 0.1 10*3/uL (ref 0.0–0.5)
HCT: 35.8 % (ref 34.8–46.6)
LYMPH%: 9.3 % — ABNORMAL LOW (ref 14.0–49.7)
MONO#: 0.4 10*3/uL (ref 0.1–0.9)
NEUT#: 5.3 10*3/uL (ref 1.5–6.5)
NEUT%: 83.2 % — ABNORMAL HIGH (ref 38.4–76.8)
Platelets: 243 10*3/uL (ref 145–400)
WBC: 6.3 10*3/uL (ref 3.9–10.3)
lymph#: 0.6 10*3/uL — ABNORMAL LOW (ref 0.9–3.3)

## 2012-06-11 MED ORDER — RADIAPLEXRX EX GEL
Freq: Once | CUTANEOUS | Status: AC
  Administered 2012-06-11: 1 via TOPICAL

## 2012-06-11 NOTE — Progress Notes (Signed)
   [facility]    OFFICE PROGRESS NOTE   INTERVAL HISTORY:   She returns as scheduled. She began Xeloda and radiation 06/09/2012. A few hours after the first dose of Xeloda she developed nausea and vomiting. This persisted over the next 24 hours and she was seen at the cancer Center yesterday for intravenous fluids and anti-emetics. Xeloda was placed on hold. She feels better today. No emesis since 06/09/2012. Her bowels are moving. She stopped the OxyContin on 06/09/2012.  Objective:  Vital signs in last 24 hours:  Blood pressure 109/73, pulse 82, temperature 97.3 F (36.3 C), temperature source Oral, resp. rate 18, height 5' 3.5" (1.613 m), weight 108 lb 11.2 oz (49.306 kg), last menstrual period 05/13/2012.    HEENT: No thrush or ulcers Lymphatics: Several pea-sized lymph nodes in the lateral left greater than right inguinal canal. Medially near the inguinal/thigh crease there is an approximate 1 cm firm mobile node. Less than 1 cm firm mobile node at the femoral side of the medial left inguinal crease Resp: Lungs clear bilaterally Cardio: Regular rate and rhythm GI: Nontender, no mass Vascular: No leg edema   Lab Results:  Lab Results  Component Value Date   WBC 6.3 06/11/2012   HGB 12.6 06/11/2012   HCT 35.8 06/11/2012   MCV 88.3 06/11/2012   PLT 243 06/11/2012   ANC 5.3    Medications: I have reviewed the patient's current medications.  Assessment/Plan: 1.Rectal cancer-low rectal cancer with probable involvement of the anal margin/sphincter musculature, status post a surgical biopsy 05/13/2012 confirming adenocarcinoma with lymphovascular invasion,[id number]  -Staging PET scan 06/01/2012 revealed increased FDG activity at the rectum, increased FDG activity in multiple abdominal/pelvic lymph nodes including a retroperitoneal node between the aorta and IVC and left inguinal nodes 2. Pain secondary to #1  3. Intermittent nausea-? Secondary to polypharmacy . She  developed acute nausea/vomiting after the first dose of Xeloda 06/09/2012. It would be very unusual to develop nausea from Xeloda. She tolerated a single pill of Xeloda earlier today. 4. Firm mobile lymph nodes at the medial left inguinal region-? Malignant  Disposition:  She started concurrent therapy on 06/09/2012. Xeloda was discontinued on 06/09/2012 secondary to nausea and vomiting. The plan is to resume full dose Xeloda this evening. She will take Zofran prior to the Xeloda as needed. Ms. Frigon will contact us for recurrent nausea/vomiting.  I discussed the case with Dr. Byrd Hesselbach and he indicates she will need a permanent colostomy. He did not recommend a biopsy of the inguinal lymph nodes. These nodes are being covered in the radiation field.  Ms. Fairclough will return for an office and lab visit on 06/24/2012.   Thornton Papas, MD  06/11/2012  3:50 PM

## 2012-06-11 NOTE — Telephone Encounter (Signed)
gve the pt her April 2014 appt calendar °

## 2012-06-11 NOTE — Progress Notes (Signed)
Department of Radiation Oncology  Phone:  [phone] Fax:        [phone]  Weekly Treatment Note    Name: Leah Le Date: 06/11/2012 MRN: [id number] DOB: July 03, 1960   Current dose: 5.4 Gy  Current fraction: 3   MEDICATIONS: Current Outpatient Prescriptions  Medication Sig Dispense Refill  . ALPRAZolam (XANAX) 0.5 MG tablet Take 0.5 mg by mouth as needed (30 Minutes before she flies.).       Marland Kitchen capecitabine (XELODA) 500 MG tablet Take #3 (1500 mg) po every am & #2 (1000 mg) po every pm = 2500 mg total daily dose on days of radiation only M-F Start 06/02/12  120 tablet  0  . lidocaine (XYLOCAINE JELLY) 2 % jelly Apply to affected area daily prn  30 mL  1  . LORazepam (ATIVAN) 0.5 MG tablet Take 1-2 tablets (0.5-1 mg total) by mouth at bedtime as needed for anxiety.  60 tablet  0  . Nutritional Supplements (JUICE PLUS FIBRE PO) Take by mouth.      . ondansetron (ZOFRAN) 8 MG tablet Take 1 tablet (8 mg total) by mouth every 8 (eight) hours as needed for nausea.  30 tablet  0  . OVER THE COUNTER MEDICATION Take 2 tablets by mouth 2 (two) times daily. Herblax stool softener      . oxyCODONE (OXYCONTIN) 10 MG 12 hr tablet Take 1 tablet (10 mg total) by mouth every 12 (twelve) hours.  50 tablet  0  . oxyCODONE-acetaminophen (PERCOCET/ROXICET) 5-325 MG per tablet Take 1 tablet by mouth every 4 (four) hours as needed for pain.  40 tablet  0  . polyethylene glycol (MIRALAX / GLYCOLAX) packet Take 17 g by mouth daily.      . prochlorperazine (COMPAZINE) 10 MG tablet Take 10 mg by mouth every 6 (six) hours as needed.      . promethazine (PHENERGAN) 12.5 MG tablet Take 1 tablet (12.5 mg total) by mouth every 6 (six) hours as needed for nausea.  30 tablet  0  . temazepam (RESTORIL) 15 MG capsule Take 1 capsule (15 mg total) by mouth at bedtime as needed for sleep.  30 capsule  0   No current facility-administered medications for this encounter.     ALLERGIES: Tetanus toxoids  and Benadryl   LABORATORY DATA:  Lab Results  Component Value Date   WBC 6.3 06/11/2012   HGB 12.6 06/11/2012   HCT 35.8 06/11/2012   MCV 88.3 06/11/2012   PLT 243 06/11/2012   Lab Results  Component Value Date   NA 137 05/13/2012   K 3.6 05/13/2012   CL 101 05/13/2012   CO2 24 05/13/2012   Lab Results  Component Value Date   ALT 8 05/13/2012   AST 18 05/13/2012   ALKPHOS 58 05/13/2012   BILITOT 0.4 05/13/2012     NARRATIVE: Leah Le was seen today for weekly treatment management. The chart was checked and the patient's films were reviewed. The patient has begun her treatment and has been having some significant nausea this week. This began Wednesday night after her first treatment and has continued. She has been feeling better today however. Some modification to her Xeloda was made over the last couple of days. She is taking nausea medication including Zofran medication currently.  PHYSICAL EXAMINATION: weight is 108 lb 6.4 oz (49.17 kg). Her temperature is 98 F (36.7 C). Her blood pressure is 116/76 and her pulse is 68.  ASSESSMENT: The patient is having some significant nausea currently.  This has improved. She is changing the way that she is taking Zofran and we also discussed her taking this medication prior to radiotherapy each day. I believe that this will help limit this but we will have to see how she does. Additional options are available.  PLAN: We will continue with the patient's radiation treatment as planned. I did discuss with the patient today the implications potentially of the results of the PET scan. We discussed how her plan was changed in terms of covering additional areas which were positive on this scan. She asked about staging which we did discuss and with non-regional lymph nodes being positive, I would stage the patient formerly as a stage IV patient. However, the patient does not have distant disease in terms of such locations as lung, brain, liver, or  bone, and with the disease limited to the retroperitoneal region along the same Path as regional nodes, we discussed that she is being treated aggressively to try to her and keep all of the disease as much as possible. The patient's radiotherapy plan account for this therefore, but increase normal tissue radiation dose is necessary to accomplish this goal. The patient indicated agreement with this plan and certainly with her age and other factors we have made a decision to treat the patient aggressively.

## 2012-06-11 NOTE — Telephone Encounter (Signed)
Called patient to discuss new Xeloda medication. No answer, left voicemail for patient to call back to discuss and have questions/concerns answered.   Christell Faith, PharmD

## 2012-06-11 NOTE — Progress Notes (Signed)
Patient and husband here for weekly assessment of newly diagnosed rectal cancer.Routine of clinic reviewed and side effects of treatment.Given Radiation Therpay and You Booklet and radiaplex.Patient already has sitz bath.Has started xeloda and will resume this evening as had nausea/vomiting and mild diarrhea.

## 2012-06-11 NOTE — Telephone Encounter (Signed)
Spoke with patient by phone.  She stated she is not nauseated, has not had any emesis since Wednesday, has been drinking fluids, and was able to tolerate breakfast this morning.  She stated she is feeling better today.  Per Dr. Truett Perna, patient is to take 1 Xeloda pill this morning and see if it causes any nausea or vomiting.  He will assess her at today's visit. The patient was agreeable to this and stated she felt like trying 1 pill.  Dr. Truett Perna informed.

## 2012-06-14 ENCOUNTER — Ambulatory Visit: Payer: BC Managed Care – PPO

## 2012-06-14 ENCOUNTER — Ambulatory Visit: Payer: BC Managed Care – PPO | Admitting: Radiation Oncology

## 2012-06-14 ENCOUNTER — Encounter (INDEPENDENT_AMBULATORY_CARE_PROVIDER_SITE_OTHER): Payer: 59 | Admitting: Surgery

## 2012-06-14 ENCOUNTER — Telehealth (INDEPENDENT_AMBULATORY_CARE_PROVIDER_SITE_OTHER): Payer: Self-pay | Admitting: General Surgery

## 2012-06-14 ENCOUNTER — Ambulatory Visit
Admission: RE | Admit: 2012-06-14 | Discharge: 2012-06-14 | Disposition: A | Discharge status: Home / Self Care | Payer: BC Managed Care – PPO | Source: Ambulatory Visit | Attending: Radiation Oncology | Admitting: Radiation Oncology

## 2012-06-14 NOTE — Telephone Encounter (Signed)
Patient will call back and ask for Pattricia Boss to make apt with Dr Corliss Skains

## 2012-06-15 ENCOUNTER — Ambulatory Visit: Payer: BC Managed Care – PPO

## 2012-06-15 ENCOUNTER — Ambulatory Visit
Admission: RE | Admit: 2012-06-15 | Discharge: 2012-06-15 | Disposition: A | Discharge status: Home / Self Care | Payer: BC Managed Care – PPO | Source: Ambulatory Visit | Attending: Radiation Oncology | Admitting: Radiation Oncology

## 2012-06-15 VITALS — BP 124/78 | HR 94 | Temp 97.6°F | Wt 106.3 lb

## 2012-06-15 DIAGNOSIS — C20 Malignant neoplasm of rectum: Secondary | ICD-10-CM

## 2012-06-15 NOTE — Progress Notes (Addendum)
Patient to nursing for assessment of nausea.States she is better today.Has taken total dosage of xeloda yesterday and this mornings dose.Taking zofran, compazine and ativan for nausea.Patient states she has been having nausea since surgery and states there was some question about abdominal distention on ct scan.Inquires if marinol (marijuana pill ) will help with nausea.Felt good today up until about 2:00 pm before becoming nauseated.Not orthostatic.I will speak with Dr.Moody to see if iv fluids need to be scheduled.

## 2012-06-16 ENCOUNTER — Ambulatory Visit: Payer: BC Managed Care – PPO

## 2012-06-16 ENCOUNTER — Ambulatory Visit: Payer: BC Managed Care – PPO | Admitting: Nutrition

## 2012-06-16 ENCOUNTER — Ambulatory Visit
Admission: RE | Admit: 2012-06-16 | Discharge: 2012-06-16 | Disposition: A | Discharge status: Home / Self Care | Payer: BC Managed Care – PPO | Source: Ambulatory Visit | Attending: Radiation Oncology | Admitting: Radiation Oncology

## 2012-06-16 ENCOUNTER — Other Ambulatory Visit (HOSPITAL_BASED_OUTPATIENT_CLINIC_OR_DEPARTMENT_OTHER): Payer: BC Managed Care – PPO | Admitting: Lab

## 2012-06-16 VITALS — BP 105/81 | HR 78 | Temp 97.8°F | Wt 107.6 lb

## 2012-06-16 DIAGNOSIS — C20 Malignant neoplasm of rectum: Secondary | ICD-10-CM

## 2012-06-16 LAB — CBC WITH DIFFERENTIAL/PLATELET
BASO%: 0.4 % (ref 0.0–2.0)
Basophils Absolute: 0 10*3/uL (ref 0.0–0.1)
HCT: 34.5 % — ABNORMAL LOW (ref 34.8–46.6)
HGB: 12.2 g/dL (ref 11.6–15.9)
LYMPH%: 9.5 % — ABNORMAL LOW (ref 14.0–49.7)
MCHC: 35.4 g/dL (ref 31.5–36.0)
MONO#: 0.3 10*3/uL (ref 0.1–0.9)
NEUT%: 78.8 % — ABNORMAL HIGH (ref 38.4–76.8)
Platelets: 215 10*3/uL (ref 145–400)
WBC: 3.7 10*3/uL — ABNORMAL LOW (ref 3.9–10.3)

## 2012-06-16 MED ORDER — DRONABINOL 5 MG PO CAPS: 5.0000 mg | ORAL_CAPSULE | Freq: Two times a day (BID) | ORAL | Status: DC

## 2012-06-16 NOTE — Progress Notes (Signed)
Department of Radiation Oncology  Phone:  [phone] Fax:        [phone]  Weekly Treatment Note    Name: Leah Le Date: 06/16/2012 MRN: [id number] DOB: Mar 22, 1960   Current dose: 10.8 Gy  Current fraction: 6   MEDICATIONS: Current Outpatient Prescriptions  Medication Sig Dispense Refill  . ALPRAZolam (XANAX) 0.5 MG tablet Take 0.5 mg by mouth as needed (30 Minutes before she flies.).       Marland Kitchen capecitabine (XELODA) 500 MG tablet Take #3 (1500 mg) po every am & #2 (1000 mg) po every pm = 2500 mg total daily dose on days of radiation only M-F Start 06/02/12  120 tablet  0  . lidocaine (XYLOCAINE JELLY) 2 % jelly Apply to affected area daily prn  30 mL  1  . LORazepam (ATIVAN) 0.5 MG tablet Take 1-2 tablets (0.5-1 mg total) by mouth at bedtime as needed for anxiety.  60 tablet  0  . Nutritional Supplements (JUICE PLUS FIBRE PO) Take by mouth.      . ondansetron (ZOFRAN) 8 MG tablet Take 1 tablet (8 mg total) by mouth every 8 (eight) hours as needed for nausea.  30 tablet  0  . OVER THE COUNTER MEDICATION Take 2 tablets by mouth 2 (two) times daily. Herblax stool softener      . oxyCODONE (OXYCONTIN) 10 MG 12 hr tablet Take 1 tablet (10 mg total) by mouth every 12 (twelve) hours.  50 tablet  0  . oxyCODONE-acetaminophen (PERCOCET/ROXICET) 5-325 MG per tablet Take 1 tablet by mouth every 4 (four) hours as needed for pain.  40 tablet  0  . polyethylene glycol (MIRALAX / GLYCOLAX) packet Take 17 g by mouth daily.      . prochlorperazine (COMPAZINE) 10 MG tablet Take 10 mg by mouth every 6 (six) hours as needed.      . promethazine (PHENERGAN) 12.5 MG tablet Take 1 tablet (12.5 mg total) by mouth every 6 (six) hours as needed for nausea.  30 tablet  0  . temazepam (RESTORIL) 15 MG capsule Take 1 capsule (15 mg total) by mouth at bedtime as needed for sleep.  30 capsule  0  . dronabinol (MARINOL) 5 MG capsule Take 1 capsule (5 mg total) by mouth 2 (two) times daily before a  meal.  60 capsule  0   No current facility-administered medications for this encounter.     ALLERGIES: Tetanus toxoids and Benadryl   LABORATORY DATA:  Lab Results  Component Value Date   WBC 3.7* 06/16/2012   HGB 12.2 06/16/2012   HCT 34.5* 06/16/2012   MCV 88.2 06/16/2012   PLT 215 06/16/2012   Lab Results  Component Value Date   NA 137 05/13/2012   K 3.6 05/13/2012   CL 101 05/13/2012   CO2 24 05/13/2012   Lab Results  Component Value Date   ALT 8 05/13/2012   AST 18 05/13/2012   ALKPHOS 58 05/13/2012   BILITOT 0.4 05/13/2012     NARRATIVE: Leah Le was seen today for weekly treatment management. The chart was checked and the patient's films were reviewed. The patient was seen again today for assessment in terms of vital signs and nausea. She has continued to have significant nausea and she states that this really has not decreased at all. The patient is interested in other alternatives. She did see the nutritionist today. Her vital signs remained stable today.  PHYSICAL EXAMINATION: weight is 107 lb 9.6 oz (  48.807 kg). Her temperature is 97.8 F (36.6 C). Her blood pressure is 105/81 and her pulse is 78.        ASSESSMENT: The patient continues to have significant nausea on an ongoing basis.   PLAN: We will continue with the patient's radiation treatment as planned.  we will continue to follow her nausea closely. She was interested in Marinol antinausea medication and I wrote her for a prescription for this today. I believe that she can take this in addition to her other medications unless increased sedation becomes an issue for her.

## 2012-06-16 NOTE — Progress Notes (Signed)
Patient here for assessment of continued nausea unrelieved with ativan, compazine or zofran.Patient's vitals are stable and not orthostatic.States she had 3 to 4 loose stools today.Stopped her miralax on yesterday.Seen by nutritionist today and also had labs.

## 2012-06-16 NOTE — Progress Notes (Signed)
Patient is a 52 year old female diagnosed with rectal cancer.   Past medical history includes ovarian cyst, migraine, and postop nausea vomiting.  Medications include Xanax, Xeloda, Ativan, juice plus fiber, Zofran, MiraLax, Phenergan, Restoril.  Labs were reviewed.  Height: 63.5 inches. Weight: 106.3 pounds. Usual body weight: 115 pounds. BMI: 18.53.  Patient describes extreme nausea which happens most afternoons and lasts throughout the evening. She states she is taking all of her nausea medications as prescribed. She has tried ginger tea, some ginger lozenges and seabands. Patient has not had success in improving nausea. Patient has lost 8% of her usual body weight. Patient would like to consider Marinol and has discussed with her healthcare team. I did not complete physical exam today as patient was very ill.  Nutrition diagnosis: Inadequate oral intake related to nausea associated with new diagnosis of rectal cancer as evidenced by 8% percent weight loss from usual body weight.  Intervention: I've educated patient to try to increase oral intake in the mornings so as to increase overall calories and protein. I've educated her on strategies for eating with nausea during afternoons and evenings. I have encouraged continued compliance with nausea medications. I have provided strategies as well as oral nutrition supplements for patient to try. I've answered her questions. Teach back method used.  Monitoring, evaluation, goals: Patient will tolerate increased calories and protein with improved nausea to minimize further weight loss.  Next visit: Patient will contact me with questions or concerns.

## 2012-06-17 ENCOUNTER — Ambulatory Visit
Admission: RE | Admit: 2012-06-17 | Discharge: 2012-06-17 | Disposition: A | Discharge status: Home / Self Care | Payer: BC Managed Care – PPO | Source: Ambulatory Visit | Attending: Radiation Oncology | Admitting: Radiation Oncology

## 2012-06-17 ENCOUNTER — Ambulatory Visit: Payer: BC Managed Care – PPO

## 2012-06-18 ENCOUNTER — Ambulatory Visit: Payer: BC Managed Care – PPO

## 2012-06-18 ENCOUNTER — Ambulatory Visit
Admission: RE | Admit: 2012-06-18 | Discharge: 2012-06-18 | Disposition: A | Discharge status: Home / Self Care | Payer: BC Managed Care – PPO | Source: Ambulatory Visit | Attending: Radiation Oncology | Admitting: Radiation Oncology

## 2012-06-18 VITALS — BP 100/75 | HR 82 | Temp 98.1°F | Ht 63.5 in | Wt 109.6 lb

## 2012-06-18 DIAGNOSIS — C20 Malignant neoplasm of rectum: Secondary | ICD-10-CM

## 2012-06-18 MED ORDER — LIDOCAINE (ANORECTAL) 5 % EX CREA: TOPICAL_CREAM | CUTANEOUS | Status: DC

## 2012-06-18 NOTE — Progress Notes (Signed)
The patient was seen today. Increased fatigue and she states some dizziness when she stands. She insists that she has been drinking well and her vital signs looked okay today in terms of vitals and orthostatics. She was given a refill for skin crane.  Will continue her radiotherapy as planned.

## 2012-06-18 NOTE — Progress Notes (Signed)
Leah Le here for weekly under treatment visit accompanied by her friend.  She has had 8/30 treatments to her pelvis.  She states that she is very fatigued today.  Her friend is wondering if the xeloda is being dosed correctly.  She states that the pain in her bottom is a 5 to a 7 and is worse than it has been.  Her appetite is good.  She also states that she is dizzy when she stands up from a sitting position.  Orthostatic vitals were taken and are lying bp 96/52, hr 67, standing bp 100/75, hr 82.

## 2012-06-21 ENCOUNTER — Telehealth: Payer: Self-pay | Admitting: *Deleted

## 2012-06-21 ENCOUNTER — Ambulatory Visit: Payer: BC Managed Care – PPO

## 2012-06-21 ENCOUNTER — Ambulatory Visit
Admission: RE | Admit: 2012-06-21 | Discharge: 2012-06-21 | Disposition: A | Discharge status: Home / Self Care | Payer: BC Managed Care – PPO | Source: Ambulatory Visit | Attending: Radiation Oncology | Admitting: Radiation Oncology

## 2012-06-21 DIAGNOSIS — C20 Malignant neoplasm of rectum: Secondary | ICD-10-CM

## 2012-06-21 NOTE — Progress Notes (Signed)
The patient was seen today. She continues to complain of some significant fatigue and decreased appetite. This appears to be stable. She does state that she is notice some changes with urination when she sits down. She denies any loss of control today except while sitting down to go to the bathroom she feels like he cannot start and stop her urinary stream as she normally would. The patient however denies any overt incontinence.  The patient is almost 2 weeks into her treatment. She is certainly having some difficulties in terms of fatigue/decreased appetite. However in reviewing her weight, she has lost approximately 4 pounds versus my initial consultation visit with her. She wishes to continue with the current plan and I believe that this is reasonable. I discussed with her possible reasons for some change in urination, which would include tumor and possible tumor swelling with radiation, as well as some effect from radiation itself which I believe will continue as she proceeds through treatment. Some burning will also be normal potential late as she continues.  We will continue her radiotherapy at this point as planned without any significant changes currently.

## 2012-06-21 NOTE — Telephone Encounter (Signed)
This RN received phone call from patient's husband stating his wife has been feeling "really bad" and staying in bed all weekend, especially yesterday.  She has not been able to control urination and is concerned about a "urinary infection".  She denies fever, burning, odor, and pain on urination.  She just complains of having no control of her bladder.  She also complains of nausea and states she always gets sick in the car on the ride over.  Her husband stated she has had 4 glasses of water today with orange juice, toast, and half a banana.  This RN spoke with Dr. Truett Perna and Dr. Mitzi Hansen.  Per Dr. Truett Perna, patient is to try taking 0.5mg  Ativan SL 1 hour prior to coming for RT.  This RN verified with patient's husband that she had not taken any Ativan since last night before bed.   Dr. Mitzi Hansen is aware and said he would see patient toady when she arrives. Patient was informed.

## 2012-06-22 ENCOUNTER — Ambulatory Visit
Admission: RE | Admit: 2012-06-22 | Discharge: 2012-06-22 | Disposition: A | Discharge status: Home / Self Care | Payer: BC Managed Care – PPO | Source: Ambulatory Visit | Attending: Radiation Oncology | Admitting: Radiation Oncology

## 2012-06-22 ENCOUNTER — Ambulatory Visit: Payer: BC Managed Care – PPO

## 2012-06-23 ENCOUNTER — Ambulatory Visit: Payer: BC Managed Care – PPO

## 2012-06-23 ENCOUNTER — Ambulatory Visit
Admission: RE | Admit: 2012-06-23 | Discharge: 2012-06-23 | Disposition: A | Discharge status: Home / Self Care | Payer: BC Managed Care – PPO | Source: Ambulatory Visit | Attending: Radiation Oncology | Admitting: Radiation Oncology

## 2012-06-23 ENCOUNTER — Other Ambulatory Visit: Payer: Self-pay | Admitting: *Deleted

## 2012-06-23 DIAGNOSIS — C20 Malignant neoplasm of rectum: Secondary | ICD-10-CM

## 2012-06-23 MED ORDER — ONDANSETRON HCL 8 MG PO TABS: 8.0000 mg | ORAL_TABLET | Freq: Three times a day (TID) | ORAL | Status: DC | PRN

## 2012-06-24 ENCOUNTER — Ambulatory Visit
Admission: RE | Admit: 2012-06-24 | Discharge: 2012-06-24 | Disposition: A | Discharge status: Home / Self Care | Payer: BC Managed Care – PPO | Source: Ambulatory Visit | Attending: Radiation Oncology | Admitting: Radiation Oncology

## 2012-06-24 ENCOUNTER — Ambulatory Visit (HOSPITAL_BASED_OUTPATIENT_CLINIC_OR_DEPARTMENT_OTHER): Payer: BC Managed Care – PPO | Admitting: Lab

## 2012-06-24 ENCOUNTER — Ambulatory Visit: Payer: BC Managed Care – PPO

## 2012-06-24 ENCOUNTER — Encounter: Payer: Self-pay | Admitting: Oncology

## 2012-06-24 ENCOUNTER — Ambulatory Visit (HOSPITAL_BASED_OUTPATIENT_CLINIC_OR_DEPARTMENT_OTHER): Payer: BC Managed Care – PPO | Admitting: Nurse Practitioner

## 2012-06-24 ENCOUNTER — Telehealth: Payer: Self-pay | Admitting: Oncology

## 2012-06-24 VITALS — BP 99/58 | HR 60 | Temp 98.5°F | Resp 18 | Ht 63.5 in | Wt 109.0 lb

## 2012-06-24 DIAGNOSIS — C20 Malignant neoplasm of rectum: Secondary | ICD-10-CM

## 2012-06-24 LAB — URINALYSIS, MICROSCOPIC - CHCC
Ketones: NEGATIVE mg/dL
Protein: NEGATIVE mg/dL
RBC / HPF: NEGATIVE (ref 0–2)
Specific Gravity, Urine: 1.01 (ref 1.003–1.035)

## 2012-06-24 MED ORDER — FLUCONAZOLE 150 MG PO TABS: 150.0000 mg | ORAL_TABLET | Freq: Every day | ORAL | Status: DC

## 2012-06-24 NOTE — Progress Notes (Signed)
OFFICE PROGRESS NOTE  Interval history:  Leah Le returns as scheduled. She continues radiation and Xeloda. She continues to have nausea. She notes improved control of the nausea with a regimen of Zofran, Ativan and Marinol. Appetite has been better this week. Bowels are moving. She continues MiraLAX. Pain at the rectum is controlled with OxyContin.  She notes pruritus at the vagina and perineum. The pruritus is similar to when she has had yeast infections in the past.  The force of the urine stream is diminished. She denies dysuria.   Objective: Blood pressure 99/58, pulse 60, temperature 98.5 F (36.9 C), temperature source Oral, resp. rate 18, height 5' 3.5" (1.613 m), weight 109 lb (49.442 kg).  Oropharynx is without thrush or ulceration. Mucous membranes are moist. Lungs are clear. Regular cardiac rhythm. Abdomen soft and nontender. No hepatomegaly. Extremities are without edema. Mild erythema at the perineum.  Lab Results: Lab Results  Component Value Date   WBC 3.7* 06/16/2012   HGB 12.2 06/16/2012   HCT 34.5* 06/16/2012   MCV 88.2 06/16/2012   PLT 215 06/16/2012    Chemistry:    Chemistry      Component Value Date/Time   NA 137 05/13/2012 1407   K 3.6 05/13/2012 1407   CL 101 05/13/2012 1407   CO2 24 05/13/2012 1407   BUN 9 05/13/2012 1407   CREATININE 0.72 05/13/2012 1407      Component Value Date/Time   CALCIUM 9.2 05/13/2012 1407   ALKPHOS 58 05/13/2012 1407   AST 18 05/13/2012 1407   ALT 8 05/13/2012 1407   BILITOT 0.4 05/13/2012 1407       Studies/Results: Nm Pet Image Initial (pi) Skull Base To Thigh  06/01/2012  *RADIOLOGY REPORT*  Clinical Data: Initial treatment strategy for rectal cancer.  NUCLEAR MEDICINE PET SKULL BASE TO THIGH  Fasting Blood Glucose:  105  Technique:  15.8 mCi F-18 FDG was injected intravenously. CT data was obtained and used for attenuation correction and anatomic localization only.  (This was not acquired as a diagnostic CT examination.)  Additional exam technical data entered on technologist worksheet.  Comparison:  CT scan 05/20/2012.  Findings:  Neck: No hypermetabolic lymph nodes in the neck.  Chest:  No hypermetabolic mediastinal or hilar nodes.  No suspicious pulmonary nodules on the CT scan.  Abdomen/Pelvis:  There is diffuse increased FDG uptake in the rectum consistent with known rectal carcinoma.  SUV max 20.5. There are also numerous FDG positive abdominal and pelvic lymph nodes ranging between 9 and 12 SUV max.  The most superior positive lymph node is in the retroperitoneal between the aorta and IVC which correlates with a 13.5 mm lymph node on the CT scan. Multiple other retroperitoneal lymph nodes are positive and in the pelvis there are left-sided internal and external iliac lymph nodes which are positive and right internal iliac nodes which are positive.  Left-sided inguinal lymph nodes are also FDG positive.  No findings for hepatic metastatic disease.  Skeleton:  No focal hypermetabolic activity to suggest skeletal metastasis.  IMPRESSION:  1.  Diffuse increased FDG uptake in the rectum consistent with known rectal cancer. 2.  FDG positive metastatic lymph node disease in the abdomen, pelvis and left inguinal area. 3.  No findings for hepatic metastatic disease.   Original Report Authenticated By: Rudie Meyer, M.D.     Medications: I have reviewed the patient's current medications.  Assessment/Plan:  1. Rectal cancer. Low rectal cancer with probable involvement of the anal  margin/sphincter musculature status post surgical biopsy 05/13/2012 confirming adenocarcinoma with lymphovascular invasion, [id number]. Staging PET scan 06/01/2012 revealed increased FDG activity at the rectum, increased FDG activity of multiple abdominal/pelvic lymph nodes including a retroperitoneal node between the aorta and IVC and left inguinal nodes. She began radiation and Xeloda on 06/09/2012. 2. Pain secondary to #1. She continues  OxyContin. 3. Intermittent nausea. She continues Marinol, Ativan and Zofran. 4. Firm mobile lymph node at the medial left inguinal region. Question malignant. 5. Pruritus at the vagina and perineum. Question yeast infection. She was prescribed Diflucan 150 mg oral x1 with a dose to be repeated in one week if symptoms are still present. 6. Decreased force of urine stream. Likely related to radiation. We will check a urinalysis.  Disposition-she continues radiation and Xeloda. She will return for a followup visit on 07/12/2012. She will contact the office in the interim with any problems.  Plan reviewed with Dr. Truett Perna.  [name] ANP/GNP-BC    Addendum-I discussed the potential for an interaction between Diflucan and Ativan as well as Diflucan and temazepam with the cancer Center pharmacist. She recommended that Ms. Yom decrease the doses of both Ativan and temazepam by half when she is taking the Diflucan. I relayed this information to Ms. Cullars sister. She will pass this information to LeahCoatney.

## 2012-06-25 ENCOUNTER — Ambulatory Visit
Admission: RE | Admit: 2012-06-25 | Discharge: 2012-06-25 | Disposition: A | Discharge status: Home / Self Care | Payer: BC Managed Care – PPO | Source: Ambulatory Visit | Attending: Radiation Oncology | Admitting: Radiation Oncology

## 2012-06-25 ENCOUNTER — Ambulatory Visit: Payer: BC Managed Care – PPO

## 2012-06-25 VITALS — BP 104/63 | HR 69 | Temp 98.5°F | Wt 107.8 lb

## 2012-06-25 DIAGNOSIS — C20 Malignant neoplasm of rectum: Secondary | ICD-10-CM

## 2012-06-25 NOTE — Progress Notes (Signed)
Department of Radiation Oncology  Phone:  [phone] Fax:        [phone]  Weekly Treatment Note    Name: Leah Le Date: 06/25/2012 MRN: [id number] DOB: July 03, 1960   Current dose: 23.4 Gy  Current fraction: 13   MEDICATIONS: Current Outpatient Prescriptions  Medication Sig Dispense Refill  . ALPRAZolam (XANAX) 0.5 MG tablet Take 0.5 mg by mouth as needed (30 Minutes before she flies.).       Marland Kitchen capecitabine (XELODA) 500 MG tablet Take #3 (1500 mg) po every am & #2 (1000 mg) po every pm = 2500 mg total daily dose on days of radiation only M-F Start 06/02/12  120 tablet  0  . dronabinol (MARINOL) 5 MG capsule Take 1 capsule (5 mg total) by mouth 2 (two) times daily before a meal.  60 capsule  0  . fluconazole (DIFLUCAN) 150 MG tablet Take 1 tablet (150 mg total) by mouth daily.  2 tablet  0  . lidocaine (XYLOCAINE JELLY) 2 % jelly Apply to affected area daily prn  30 mL  1  . Lidocaine, Anorectal, 5 % CREA Apply to affected area BID prn.  30 g  1  . LORazepam (ATIVAN) 0.5 MG tablet Take 1-2 tablets (0.5-1 mg total) by mouth at bedtime as needed for anxiety.  60 tablet  0  . Nutritional Supplements (JUICE PLUS FIBRE PO) Take by mouth.      . ondansetron (ZOFRAN) 8 MG tablet Take 1 tablet (8 mg total) by mouth every 8 (eight) hours as needed for nausea.  30 tablet  3  . OVER THE COUNTER MEDICATION Take 2 tablets by mouth 2 (two) times daily. Herblax stool softener      . oxyCODONE (OXYCONTIN) 10 MG 12 hr tablet Take 1 tablet (10 mg total) by mouth every 12 (twelve) hours.  50 tablet  0  . oxyCODONE-acetaminophen (PERCOCET/ROXICET) 5-325 MG per tablet Take 1 tablet by mouth every 4 (four) hours as needed for pain.  40 tablet  0  . polyethylene glycol (MIRALAX / GLYCOLAX) packet Take 17 g by mouth daily.      . prochlorperazine (COMPAZINE) 10 MG tablet Take 10 mg by mouth every 6 (six) hours as needed.      . promethazine (PHENERGAN) 12.5 MG tablet Take 1 tablet (12.5 mg  total) by mouth every 6 (six) hours as needed for nausea.  30 tablet  0  . temazepam (RESTORIL) 15 MG capsule Take 1 capsule (15 mg total) by mouth at bedtime as needed for sleep.  30 capsule  0   No current facility-administered medications for this encounter.     ALLERGIES: Tetanus toxoids and Benadryl   LABORATORY DATA:  Lab Results  Component Value Date   WBC 3.7* 06/16/2012   HGB 12.2 06/16/2012   HCT 34.5* 06/16/2012   MCV 88.2 06/16/2012   PLT 215 06/16/2012   Lab Results  Component Value Date   NA 137 05/13/2012   K 3.6 05/13/2012   CL 101 05/13/2012   CO2 24 05/13/2012   Lab Results  Component Value Date   ALT 8 05/13/2012   AST 18 05/13/2012   ALKPHOS 58 05/13/2012   BILITOT 0.4 05/13/2012     NARRATIVE: Kharter A Lazo was seen today for weekly treatment management. The chart was checked and the patient's films were reviewed. The patient has been fairly stable recently. Her nausea has improved a little bit. Weight has been stable.  PHYSICAL EXAMINATION: weight  is 107 lb 12.8 oz (48.898 kg). Her temperature is 98.5 F (36.9 C). Her blood pressure is 104/63 and her pulse is 69. Her oxygen saturation is 100%.        ASSESSMENT: The patient is doing satisfactorily with treatment.  PLAN: We will continue with the patient's radiation treatment as planned. I am pleased with how the patient has done over the last week. She certainly is going to have somewhat of a difficult time during the remainder of the treatment I believe, but if  she can maintain her weight and be somewhat comfortable then this would certainly be acceptable.

## 2012-06-25 NOTE — Progress Notes (Addendum)
Patient here for routine weekly assessment of radiation to rectum.Completed 13 of 30 treatments.Nausea and appetite have improved on Marinol.Given diflucan for possible yeast infection of rectal area.Had bad episode of constipation on Sunday but doing much better today.Denies urinary burning or frequency.Urinalysis from 06/24/12 negative except few bacteria.

## 2012-06-28 ENCOUNTER — Ambulatory Visit: Payer: BC Managed Care – PPO

## 2012-06-28 ENCOUNTER — Encounter: Payer: Self-pay | Admitting: *Deleted

## 2012-06-28 ENCOUNTER — Ambulatory Visit
Admission: RE | Admit: 2012-06-28 | Discharge: 2012-06-28 | Disposition: A | Discharge status: Home / Self Care | Payer: BC Managed Care – PPO | Source: Ambulatory Visit | Attending: Radiation Oncology | Admitting: Radiation Oncology

## 2012-06-28 NOTE — Progress Notes (Signed)
RECEIVED A FAX FROM [facility] CONCERNING A CONFIRMATION OF PRESCRIPTION SHIPMENT FOR XELODA ON 06/25/12.

## 2012-06-29 ENCOUNTER — Ambulatory Visit: Payer: BC Managed Care – PPO

## 2012-06-29 ENCOUNTER — Ambulatory Visit
Admission: RE | Admit: 2012-06-29 | Discharge: 2012-06-29 | Disposition: A | Discharge status: Home / Self Care | Payer: BC Managed Care – PPO | Source: Ambulatory Visit | Attending: Radiation Oncology | Admitting: Radiation Oncology

## 2012-06-30 ENCOUNTER — Ambulatory Visit: Payer: BC Managed Care – PPO

## 2012-06-30 ENCOUNTER — Ambulatory Visit
Admission: RE | Admit: 2012-06-30 | Discharge: 2012-06-30 | Disposition: A | Discharge status: Home / Self Care | Payer: BC Managed Care – PPO | Source: Ambulatory Visit | Attending: Radiation Oncology | Admitting: Radiation Oncology

## 2012-07-01 ENCOUNTER — Telehealth: Payer: Self-pay

## 2012-07-01 ENCOUNTER — Ambulatory Visit: Payer: BC Managed Care – PPO

## 2012-07-01 NOTE — Telephone Encounter (Signed)
Spoke with patient's husband and she will not be coming in for treatment today as she is too nauseated.Will hold xeloda this evening and see how patient feels in the morning before resuming.Patient isn't eating anything but a little toast.I have a seat for intravenous fluids tomorrow at 2:45 pm.Mr.Gibbs will call me in the morning to update me on patient status.

## 2012-07-02 ENCOUNTER — Inpatient Hospital Stay (HOSPITAL_COMMUNITY)
Admission: AD | Admit: 2012-07-02 | Discharge: 2012-07-11 | DRG: 182 | Disposition: A | Discharge status: Home / Self Care | Payer: BC Managed Care – PPO | Source: Ambulatory Visit | Attending: Oncology | Admitting: Oncology

## 2012-07-02 ENCOUNTER — Other Ambulatory Visit: Payer: Self-pay

## 2012-07-02 ENCOUNTER — Encounter (HOSPITAL_COMMUNITY): Payer: Self-pay

## 2012-07-02 ENCOUNTER — Encounter: Payer: Self-pay | Admitting: Radiation Oncology

## 2012-07-02 ENCOUNTER — Ambulatory Visit
Admission: RE | Admit: 2012-07-02 | Discharge: 2012-07-02 | Disposition: A | Discharge status: Home / Self Care | Payer: BC Managed Care – PPO | Source: Ambulatory Visit | Attending: Radiation Oncology | Admitting: Radiation Oncology

## 2012-07-02 ENCOUNTER — Ambulatory Visit: Payer: BC Managed Care – PPO

## 2012-07-02 ENCOUNTER — Ambulatory Visit (HOSPITAL_BASED_OUTPATIENT_CLINIC_OR_DEPARTMENT_OTHER): Payer: BC Managed Care – PPO

## 2012-07-02 ENCOUNTER — Ambulatory Visit
Admission: RE | Admit: 2012-07-02 | Payer: BC Managed Care – PPO | Source: Ambulatory Visit | Admitting: Radiation Oncology

## 2012-07-02 VITALS — BP 96/76 | HR 86 | Temp 97.6°F | Resp 18

## 2012-07-02 VITALS — BP 99/68 | HR 82 | Temp 97.1°F | Resp 18

## 2012-07-02 DIAGNOSIS — G2402 Drug induced acute dystonia: Secondary | ICD-10-CM | POA: Diagnosis present

## 2012-07-02 DIAGNOSIS — R110 Nausea: Secondary | ICD-10-CM

## 2012-07-02 DIAGNOSIS — C20 Malignant neoplasm of rectum: Secondary | ICD-10-CM | POA: Diagnosis present

## 2012-07-02 DIAGNOSIS — K2990 Gastroduodenitis, unspecified, without bleeding: Secondary | ICD-10-CM | POA: Diagnosis present

## 2012-07-02 DIAGNOSIS — T426X5A Adverse effect of other antiepileptic and sedative-hypnotic drugs, initial encounter: Secondary | ICD-10-CM | POA: Diagnosis present

## 2012-07-02 DIAGNOSIS — K2970 Gastritis, unspecified, without bleeding: Secondary | ICD-10-CM | POA: Diagnosis present

## 2012-07-02 DIAGNOSIS — K3184 Gastroparesis: Principal | ICD-10-CM | POA: Diagnosis present

## 2012-07-02 DIAGNOSIS — R259 Unspecified abnormal involuntary movements: Secondary | ICD-10-CM

## 2012-07-02 DIAGNOSIS — E876 Hypokalemia: Secondary | ICD-10-CM

## 2012-07-02 DIAGNOSIS — A0472 Enterocolitis due to Clostridium difficile, not specified as recurrent: Secondary | ICD-10-CM | POA: Diagnosis present

## 2012-07-02 DIAGNOSIS — Z9221 Personal history of antineoplastic chemotherapy: Secondary | ICD-10-CM

## 2012-07-02 DIAGNOSIS — Z79899 Other long term (current) drug therapy: Secondary | ICD-10-CM

## 2012-07-02 LAB — MAGNESIUM: Magnesium: 1.7 mg/dL (ref 1.5–2.5)

## 2012-07-02 LAB — COMPREHENSIVE METABOLIC PANEL
Albumin: 3 g/dL — ABNORMAL LOW (ref 3.5–5.2)
BUN: 8 mg/dL (ref 6–23)
Creatinine, Ser: 0.65 mg/dL (ref 0.50–1.10)
GFR calc Af Amer: >90 mL/min (ref >90)
Glucose, Bld: 95 mg/dL (ref 70–99)
Total Protein: 5.7 g/dL — ABNORMAL LOW (ref 6.0–8.3)

## 2012-07-02 LAB — CBC WITH DIFFERENTIAL/PLATELET
Basophils Absolute: 0 10*3/uL (ref 0.0–0.1)
Basophils Relative: 0 % (ref 0–1)
Eosinophils Absolute: 1.2 10*3/uL — ABNORMAL HIGH (ref 0.0–0.7)
Eosinophils Relative: 27 % — ABNORMAL HIGH (ref 0–5)
Lymphocytes Relative: 7 % — ABNORMAL LOW (ref 12–46)
MCH: 32.1 pg (ref 26.0–34.0)
MCHC: 36.9 g/dL — ABNORMAL HIGH (ref 30.0–36.0)
MCV: 87 fL (ref 78.0–100.0)
Platelets: 153 10*3/uL (ref 150–400)
RDW: 13 % (ref 11.5–15.5)
WBC: 4.5 10*3/uL (ref 4.0–10.5)

## 2012-07-02 MED ORDER — LORAZEPAM 2 MG/ML IJ SOLN
0.5000 mg | Freq: Four times a day (QID) | INTRAMUSCULAR | Status: DC | PRN
  Administered 2012-07-02 – 2012-07-08 (×15): 0.5 mg via INTRAVENOUS
  Filled 2012-07-02 (×15): qty 1

## 2012-07-02 MED ORDER — SODIUM CHLORIDE 0.9 % IV SOLN: INTRAVENOUS | Status: DC

## 2012-07-02 MED ORDER — ACETAMINOPHEN 650 MG RE SUPP: 650.0000 mg | Freq: Four times a day (QID) | RECTAL | Status: DC | PRN

## 2012-07-02 MED ORDER — SODIUM CHLORIDE 0.9 % IV SOLN
Freq: Once | INTRAVENOUS | Status: DC
  Filled 2012-07-02: qty 1000

## 2012-07-02 MED ORDER — PROMETHAZINE HCL 25 MG/ML IJ SOLN
25.0000 mg | Freq: Once | INTRAMUSCULAR | Status: AC
  Administered 2012-07-02: 25 mg via INTRAVENOUS
  Filled 2012-07-02: qty 1

## 2012-07-02 MED ORDER — ACETAMINOPHEN 325 MG PO TABS
650.0000 mg | ORAL_TABLET | Freq: Four times a day (QID) | ORAL | Status: DC | PRN
  Administered 2012-07-04 – 2012-07-10 (×3): 650 mg via ORAL
  Filled 2012-07-02 (×3): qty 2

## 2012-07-02 MED ORDER — ENOXAPARIN SODIUM 40 MG/0.4ML ~~LOC~~ SOLN
40.0000 mg | SUBCUTANEOUS | Status: DC
  Filled 2012-07-02 (×7): qty 0.4

## 2012-07-02 MED ORDER — SODIUM CHLORIDE 0.9 % IV SOLN
INTRAVENOUS | Status: DC
  Administered 2012-07-02 – 2012-07-03 (×2): via INTRAVENOUS
  Filled 2012-07-02 (×3): qty 1000

## 2012-07-02 MED ORDER — OXYCODONE HCL 5 MG PO TABS: 5.0000 mg | ORAL_TABLET | ORAL | Status: DC | PRN

## 2012-07-02 MED ORDER — SODIUM CHLORIDE 0.9 % IV SOLN
Freq: Once | INTRAVENOUS | Status: AC
  Administered 2012-07-02: 15:00:00 via INTRAVENOUS

## 2012-07-02 MED ORDER — PROMETHAZINE HCL 25 MG PO TABS: 12.5000 mg | ORAL_TABLET | Freq: Four times a day (QID) | ORAL | Status: DC | PRN

## 2012-07-02 NOTE — Progress Notes (Signed)
Department of Radiation Oncology  Phone:  [phone] Fax:        [phone]  Weekly Treatment Note    Name: Leah Le Date: 07/02/2012 MRN: [id number] DOB: 1960/04/24    Current fraction: 17   MEDICATIONS: Current Outpatient Prescriptions  Medication Sig Dispense Refill  . ALPRAZolam (XANAX) 0.5 MG tablet Take 0.5 mg by mouth as needed (30 Minutes before she flies.).       Marland Kitchen capecitabine (XELODA) 500 MG tablet Take #3 (1500 mg) po every am & #2 (1000 mg) po every pm = 2500 mg total daily dose on days of radiation only M-F Start 06/02/12  120 tablet  0  . dronabinol (MARINOL) 5 MG capsule Take 1 capsule (5 mg total) by mouth 2 (two) times daily before a meal.  60 capsule  0  . fluconazole (DIFLUCAN) 150 MG tablet Take 1 tablet (150 mg total) by mouth daily.  2 tablet  0  . lidocaine (XYLOCAINE JELLY) 2 % jelly Apply to affected area daily prn  30 mL  1  . Lidocaine, Anorectal, 5 % CREA Apply to affected area BID prn.  30 g  1  . LORazepam (ATIVAN) 0.5 MG tablet Take 1-2 tablets (0.5-1 mg total) by mouth at bedtime as needed for anxiety.  60 tablet  0  . Nutritional Supplements (JUICE PLUS FIBRE PO) Take by mouth.      . ondansetron (ZOFRAN) 8 MG tablet Take 1 tablet (8 mg total) by mouth every 8 (eight) hours as needed for nausea.  30 tablet  3  . OVER THE COUNTER MEDICATION Take 2 tablets by mouth 2 (two) times daily. Herblax stool softener      . oxyCODONE (OXYCONTIN) 10 MG 12 hr tablet Take 1 tablet (10 mg total) by mouth every 12 (twelve) hours.  50 tablet  0  . oxyCODONE-acetaminophen (PERCOCET/ROXICET) 5-325 MG per tablet Take 1 tablet by mouth every 4 (four) hours as needed for pain.  40 tablet  0  . polyethylene glycol (MIRALAX / GLYCOLAX) packet Take 17 g by mouth daily.      . prochlorperazine (COMPAZINE) 10 MG tablet Take 10 mg by mouth every 6 (six) hours as needed.      . promethazine (PHENERGAN) 12.5 MG tablet Take 1 tablet (12.5 mg total) by mouth every  6 (six) hours as needed for nausea.  30 tablet  0  . temazepam (RESTORIL) 15 MG capsule Take 1 capsule (15 mg total) by mouth at bedtime as needed for sleep.  30 capsule  0   No current facility-administered medications for this encounter.     ALLERGIES: Tetanus toxoids and Benadryl   LABORATORY DATA:  Lab Results  Component Value Date   WBC 3.7* 06/16/2012   HGB 12.2 06/16/2012   HCT 34.5* 06/16/2012   MCV 88.2 06/16/2012   PLT 215 06/16/2012   Lab Results  Component Value Date   NA 137 05/13/2012   K 3.6 05/13/2012   CL 101 05/13/2012   CO2 24 05/13/2012   Lab Results  Component Value Date   ALT 8 05/13/2012   AST 18 05/13/2012   ALKPHOS 58 05/13/2012   BILITOT 0.4 05/13/2012     NARRATIVE: Leah Le was seen today for weekly treatment management. The chart was checked and the patient's films were reviewed. The patient was given some IV fluids today. She has been having a lot more nausea over the last couple of days with decreased energy. She  was also having some jerking involuntarily on the treatment table and they were not able to proceed with her treatment.  PHYSICAL EXAMINATION: oral temperature is 97.6 F (36.4 C). Her blood pressure is 96/76 and her pulse is 86. Her respiration is 18.      the patient looks more ill today. She is alert and oriented. I was able to witness some of the shaking/myoclonic activity. The patient was alert during this and it did not appear to represent seizure activity.   ASSESSMENT: The patient is having a difficult time with treatment. I talked to Dr. Truett Perna who was kind enough to examine the patient here in our clinic as well. He felt that it was reasonable to admit her, and hopefully we will be able to improve the nausea and improve her fluid status as well. The jerking may represent a drug reaction she did get some Phenergan earlier. If this were to persist, imaging of the brain also may be appropriate to rule out CNS causes.  PLAN:  We decided  to hold off on treatment today do to her inability to lay still on the table. Hopefully we will resume her treatment on Monday.

## 2012-07-02 NOTE — Progress Notes (Signed)
Called to treatment area to assess patient vitals. Patient laying on left side with her head in her family members lap. Patient alert and oriented to person, place, and time. Flat affect noted. Patient frequently jerking from rectal spasms. Patient reports nausea for which she was given phenergan via IV in medical oncology. Assisted patient into a position where she could feel more comfortable. Normal saline infusing via IV. Vitals WDL. Reported all findings to Dr. Mitzi Hansen. Understand from [place], RT that patient will not be treated with radiation therapy today.

## 2012-07-02 NOTE — H&P (Signed)
Patient History and Physical   Leah Le [id number] [birth year] 52 y.o. 07/02/2012    Patient Identification: 52 year old with rectal cancer currently being treated with neoadjuvant Xeloda and radiation. She is admitted with intractable nausea.  HPI:  Ms. Splinter was diagnosed with rectal cancer in February 2014. A staging PET scan 06/01/2012 revealed increased activity at the rectum and multiple abdominal/pelvic lymph nodes. She began concurrent radiation and Xeloda on 06/09/2012. She developed nausea on day 1 of treatment and this has persisted. Phenergan has helped the nausea, but she is been able to 8 and drink very little over the past several days. She has a loose stools but, but no Frank diarrhea. She complains of dysuria.  She presented to the cancer Center today for scheduled radiation. She was treated with intravenous fluids and Phenergan in the chemotherapy Center. When she presented for radiation she was noted to have "spasms "of the body and appeared ill. I was asked to evaluate her.  PMH:  Past Medical History  Diagnosis Date  . Allergy     seasonal  . Ovarian cyst   . Migraine     manages with advil migreaine , sleep and dark room.  .  G7 P6-one miscarriage    . PONV (postoperative nausea and vomiting)   .  rectal adenocarcinoma (U T3 N0)  05/13/12    per biopsy  . Rectal bleeding     Past Surgical History  Procedure Laterality Date  . Breast enhancement surgery  2011  . Tummy tuck  2009  . Hernia repair      as an infant  . Examination under anesthesia N/A 05/13/2012    Procedure: EXAM UNDER ANESTHESIA;  Surgeon: Wilmon Arms. Corliss Skains, MD;  Location: [facility] OR;  Service: General;  Laterality: N/A;  . Hemorrhoid surgery N/A 05/13/2012    Procedure: HEMORRHOIDECTOMY;  Surgeon: Wilmon Arms. Corliss Skains, MD;  Location: [facility] OR;  Service: General;  Laterality: N/A;  biopsies of anal lesions  . Eus N/A 05/19/2012    Procedure: LOWER ENDOSCOPIC ULTRASOUND (EUS);  Surgeon: Willis Modena, MD;  Location: [facility];  Service: Endoscopy;  Laterality: N/A;  . Colonoscopy N/A 05/19/2012    Procedure: COLONOSCOPY;  Surgeon: Willis Modena, MD;  Location: [facility];  Service: Endoscopy;  Laterality: N/A;  . Resection of anus  05/13/2012    adenocarcinoma    Allergies:  Allergies  Allergen Reactions  . Tetanus Toxoids Other (See Comments)    Got extremely ill for days  . Benadryl (Diphenhydramine Hcl) Palpitations    Medications: Per electronic medical record  Social History:   She lives with her husband in [place]. She is a high school Editor, commissioning. She does not use cigarettes. She reports social alcohol use. No transfusion history. No risk factor for HIV or hepatitis.  Family History:  Family History  Problem Relation Age of Onset  . Other Father     amyloidosis   .   Prostate cancer                                paternal uncle  Review of Systems:  Positives include: Constant nausea, burning with urination and difficulty controlling urinary stream, chills, generalized weakness, abdominal pain  A complete ROS was otherwise negative.   Physical Exam: She appears weak, she is having shaking chills. She was noted to have a few episodes of spasmodic movements of the trunk and extremities while alert  HEENT: Oropharynx without thrush or ulcers Lungs: Clear bilaterally Cardiac: Regular rate and rhythm Abdomen: Nontender, no hepatomegaly, no mass  Vascular: No leg edema Neurologic: Alert and oriented, the motor exam appears grossly intact in the upper and lower extremities Skin: No rash. Erythema at the perineum without skin breakdown  Lab Results: CBC and chemistry pending  Radiological Studies:  None today  Impression and Plan:  1. Rectal cancer-currently completing neoadjuvant Xeloda and radiation, started on 06/09/2012 2. Intractable nausea-? Related to treatment versus rectal cancer 3. Dysuria 4. Involuntary movements noted today  while in radiation oncology-? Dystonic reaction related to IV Phenergan. I have a low clinical suspicion for a seizure  She will be admitted for evaluation and management of the intractable nausea and atypical movements noted while in radiation oncology today. We will check the serum electrolytes and hold further Phenergan.  We will check a urinalysis and initiate antibiotic therapy as indicated. She will be placed on intravenous hydration and anti-emetics.    Thornton Papas, MD 07/02/2012, 5:43 PM

## 2012-07-02 NOTE — Progress Notes (Signed)
Leah Le admitted to 1316 for continued nausea/vomiting and diarrhea.  She was experiencing episodes of itching and spasms like reactions in which she was arching her back, scratching and clapping her hands.  She received IV Phenergan in Medical Oncology with 1 liter of 0.9 NS for hydration. Escorted to the IP unit  With her sister present.  Report Given to [place], Charity fundraiser.

## 2012-07-02 NOTE — Patient Instructions (Addendum)
Dehydration, Adult Dehydration is when you lose more fluids from the body than you take in. Vital organs like the kidneys, brain, and heart cannot function without a proper amount of fluids and salt. Any loss of fluids from the body can cause dehydration.  CAUSES   Vomiting.  Diarrhea.  Excessive sweating.  Excessive urine output.  Fever. SYMPTOMS  Mild dehydration  Thirst.  Dry lips.  Slightly dry mouth. Moderate dehydration  Very dry mouth.  Sunken eyes.  Skin does not bounce back quickly when lightly pinched and released.  Dark urine and decreased urine production.  Decreased tear production.  Headache. Severe dehydration  Very dry mouth.  Extreme thirst.  Rapid, weak pulse (more than 100 beats per minute at rest).  Cold hands and feet.  Not able to sweat in spite of heat and temperature.  Rapid breathing.  Blue lips.  Confusion and lethargy.  Difficulty being awakened.  Minimal urine production.  No tears. DIAGNOSIS  Your caregiver will diagnose dehydration based on your symptoms and your exam. Blood and urine tests will help confirm the diagnosis. The diagnostic evaluation should also identify the cause of dehydration. TREATMENT  Treatment of mild or moderate dehydration can often be done at home by increasing the amount of fluids that you drink. It is best to drink small amounts of fluid more often. Drinking too much at one time can make vomiting worse. Refer to the home care instructions below. Severe dehydration needs to be treated at the hospital where you will probably be given intravenous (IV) fluids that contain water and electrolytes. HOME CARE INSTRUCTIONS   Ask your caregiver about specific rehydration instructions.  Drink enough fluids to keep your urine clear or pale yellow.  Drink small amounts frequently if you have nausea and vomiting.  Eat as you normally do.  Avoid:  Foods or drinks high in sugar.  Carbonated  drinks.  Juice.  Extremely hot or cold fluids.  Drinks with caffeine.  Fatty, greasy foods.  Alcohol.  Tobacco.  Overeating.  Gelatin desserts.  Wash your hands well to avoid spreading bacteria and viruses.  Only take over-the-counter or prescription medicines for pain, discomfort, or fever as directed by your caregiver.  Ask your caregiver if you should continue all prescribed and over-the-counter medicines.  Keep all follow-up appointments with your caregiver. SEEK MEDICAL CARE IF:  You have abdominal pain and it increases or stays in one area (localizes).  You have a rash, stiff neck, or severe headache.  You are irritable, sleepy, or difficult to awaken.  You are weak, dizzy, or extremely thirsty. SEEK IMMEDIATE MEDICAL CARE IF:   You are unable to keep fluids down or you get worse despite treatment.  You have frequent episodes of vomiting or diarrhea.  You have blood or green matter (bile) in your vomit.  You have blood in your stool or your stool looks black and tarry.  You have not urinated in 6 to 8 hours, or you have only urinated a small amount of very dark urine.  You have a fever.  You faint. MAKE SURE YOU:   Understand these instructions.  Will watch your condition.  Will get help right away if you are not doing well or get worse. Document Released: 03/03/2005 Document Revised: 05/26/2011 Document Reviewed: 10/21/2010 Exit[employer] Patient Information 2013 [employer].  

## 2012-07-03 LAB — URINE MICROSCOPIC-ADD ON

## 2012-07-03 LAB — URINALYSIS, ROUTINE W REFLEX MICROSCOPIC
Bilirubin Urine: NEGATIVE
Ketones, ur: NEGATIVE mg/dL
Nitrite: NEGATIVE
Protein, ur: NEGATIVE mg/dL
Urobilinogen, UA: 0.2 mg/dL (ref 0.0–1.0)

## 2012-07-03 LAB — BASIC METABOLIC PANEL
CO2: 26 mEq/L (ref 19–32)
Calcium: 8.2 mg/dL — ABNORMAL LOW (ref 8.4–10.5)
Chloride: 105 mEq/L (ref 96–112)
GFR calc Af Amer: >90 mL/min (ref >90)
Sodium: 136 mEq/L (ref 135–145)

## 2012-07-03 MED ORDER — SODIUM CHLORIDE 0.9 % IV SOLN
INTRAVENOUS | Status: DC
  Administered 2012-07-03 – 2012-07-05 (×4): via INTRAVENOUS
  Filled 2012-07-03 (×8): qty 1000

## 2012-07-03 MED ORDER — PREDNISONE 10 MG PO TABS
10.0000 mg | ORAL_TABLET | Freq: Every day | ORAL | Status: DC
  Administered 2012-07-03 – 2012-07-08 (×6): 10 mg via ORAL
  Filled 2012-07-03 (×8): qty 1

## 2012-07-03 MED ORDER — PREDNISONE 10 MG PO TABS: 10.0000 mg | ORAL_TABLET | Freq: Every day | ORAL | Status: DC

## 2012-07-03 NOTE — Progress Notes (Signed)
IP PROGRESS NOTE  Subjective:   She feels better. The "jerking "involuntary movements lasted until late last night and have resolved. No nausea at present. She reports nearly constant nausea while at home that is better in the mornings. The rectal pain has improved since beginning treatment. Her nausea predated chemotherapy and radiation. Her husband reports that she has been very disabled secondary to the nausea. She stays on the sofa for him regarding the day.  Objective: Vital signs in last 24 hours: Blood pressure 98/56, pulse 70, temperature 95.6 F (35.3 C), temperature source Oral, resp. rate 16, height 5' 3.5" (1.613 m), weight 107 lb (48.535 kg), SpO2 98.00%.  Intake/Output from previous day:    Physical Exam:  HEENT: No thrush Lungs: Clear bilaterally Cardiac: Regular rate and rhythm Abdomen: Nontender, bowel sounds are present, no hepatomegaly Extremities: No leg edema Skin: Palms without erythema   Lab Results:  Recent Labs  07/02/12 1900  WBC 4.5  HGB 11.4*  HCT 30.9*  PLT 153    BMET  Recent Labs  07/02/12 1900 07/03/12 0408  NA 141 136  K 3.0* 3.8  CL 108 105  CO2 27 26  GLUCOSE 95 91  BUN 8 7  CREATININE 0.65 0.66  CALCIUM 8.2* 8.2*    Studies/Results: No results found.  Medications: I have reviewed the patient's current medications.  Assessment/Plan:  1. Rectal cancer-currently completing neoadjuvant Xeloda and radiation, started on 06/09/2012  2. Intractable nausea-? Related to treatment versus rectal cancer , the nausea predating the start of Xeloda/radiation 3. Dysuria -? Radiation cystitis, a urinalysis was unremarkable hospital admission 07/02/2012. 4. Involuntary movements noted while in radiation oncology 07/02/2012 and continuing into the p.m. on 07/02/2012, now resolved-? Dystonic reaction related to IV Phenergan.  She has consistent nausea and a poor performance status. The nausea is either related to the underlying rectal  cancer (? Unrecognized tumor spread in the abdomen) versus treatment related. The nausea has not improved with Zofran, Compazine/Phenergan, Ativan, and Marinol. We will discontinue Phenergan secondary to the apparent reaction on 07/02/2012. I am reluctant to begin a trial of Reglan. She will be started on low-dose prednisone.  Mrs. Tisby will attempt to stay out of bed today. We will decide later today whether she can be discharged to home.  The rectal pain is significantly improved. OxyContin will be discontinued.   We discussed the possibility that some of her symptoms may be related to "depression ".      LOS: 1 day   Mukhtar Shams, [name]  07/03/2012, 8:49 AM

## 2012-07-04 ENCOUNTER — Inpatient Hospital Stay (HOSPITAL_COMMUNITY): Payer: BC Managed Care – PPO

## 2012-07-04 DIAGNOSIS — R300 Dysuria: Secondary | ICD-10-CM

## 2012-07-04 DIAGNOSIS — C20 Malignant neoplasm of rectum: Secondary | ICD-10-CM

## 2012-07-04 DIAGNOSIS — R197 Diarrhea, unspecified: Secondary | ICD-10-CM

## 2012-07-04 DIAGNOSIS — R110 Nausea: Secondary | ICD-10-CM

## 2012-07-04 LAB — CLOSTRIDIUM DIFFICILE BY PCR: Toxigenic C. Difficile by PCR: POSITIVE — AB

## 2012-07-04 MED ORDER — AMITRIPTYLINE HCL 50 MG PO TABS
50.0000 mg | ORAL_TABLET | Freq: Every day | ORAL | Status: DC
  Administered 2012-07-04: 50 mg via ORAL
  Filled 2012-07-04 (×2): qty 1

## 2012-07-04 MED ORDER — PREDNISONE 10 MG PO TABS: 10.0000 mg | ORAL_TABLET | Freq: Every day | ORAL | Status: DC

## 2012-07-04 MED ORDER — METRONIDAZOLE 500 MG PO TABS
500.0000 mg | ORAL_TABLET | Freq: Three times a day (TID) | ORAL | Status: DC
  Administered 2012-07-04 – 2012-07-07 (×11): 500 mg via ORAL
  Filled 2012-07-04 (×15): qty 1

## 2012-07-04 MED ORDER — GADOBENATE DIMEGLUMINE 529 MG/ML IV SOLN
11.0000 mL | Freq: Once | INTRAVENOUS | Status: AC | PRN
  Administered 2012-07-04: 11 mL via INTRAVENOUS

## 2012-07-04 NOTE — Progress Notes (Signed)
Leah Le   DOB:19-Sep-1960   WU#:[id number]   [id number]  Subjective: patient was feeling well when she spoke with Dr Truett Perna earlier today, but now [phone] AM) she reports her nausea is back, she has had diarrhea "about 7 times" this morning, and she is curled up in bed, receiving ativan; mother in room is concerned about completing chemo and nutrition issues, wonders if TNA should be started. They tell me patient's daughter is getting married in 14 days and "how is she going to be there?"-- patient denies vomiting, dysphagia or odynophagia; she was able to ambulate some yesterday; she "can't eat or drink" because of the nausea; denies dizzyness except "from the blood pressure medications"; denies headaches   Objective: middle aged white woman examined in bed Filed Vitals:   07/04/12 0540  BP: 106/55  Pulse: 80  Temp: 98.9 F (37.2 C)  Resp: 15    Body mass index is 18.65 kg/(m^2).  Intake/Output Summary (Last 24 hours) at 07/04/12 0748 Last data filed at 07/04/12 0540  Gross per 24 hour  Intake   1200 ml  Output      0 ml  Net   1200 ml     Sclerae unicteric  No peripheral adenopathy  Lungs no rales or rhonchi, auscultated anteriorly  Heart regular rate and rhythm  Abdomen soft. +BS  MSK no peripheral edema  Neuro nonfocal  CBG (last 3)  No results found for this basename: GLUCAP,  in the last 72 hours   Labs:  Lab Results  Component Value Date   WBC 4.5 07/02/2012   HGB 11.4* 07/02/2012   HCT 30.9* 07/02/2012   MCV 87.0 07/02/2012   PLT 153 07/02/2012   NEUTROABS 2.7 07/02/2012    @LASTCHEMISTRY @  Urine Studies No results found for this basename: UACOL, UAPR, USPG, UPH, UTP, UGL, UKET, UBIL, UHGB, UNIT, UROB, ULEU, UEPI, UWBC, URBC, UBAC, CAST, CRYS, UCOM, BILUA,  in the last 72 hours  Basic Metabolic Panel:  Recent Labs Lab 07/02/12 1900 07/03/12 0408  NA 141 136  K 3.0* 3.8  CL 108 105  CO2 27 26  GLUCOSE 95 91  BUN 8 7  CREATININE 0.65 0.66   CALCIUM 8.2* 8.2*  MG 1.7  --    GFR Estimated Creatinine Clearance: 65.1 ml/min (by C-G formula based on Cr of 0.66). Liver Function Tests:  Recent Labs Lab 07/02/12 1900  AST 24  ALT 16  ALKPHOS 45  BILITOT 0.4  PROT 5.7*  ALBUMIN 3.0*   No results found for this basename: LIPASE, AMYLASE,  in the last 168 hours No results found for this basename: AMMONIA,  in the last 168 hours Coagulation profile No results found for this basename: INR, PROTIME,  in the last 168 hours  CBC:  Recent Labs Lab 07/02/12 1900  WBC 4.5  NEUTROABS 2.7  HGB 11.4*  HCT 30.9*  MCV 87.0  PLT 153   Cardiac Enzymes: No results found for this basename: CKTOTAL, CKMB, CKMBINDEX, TROPONINI,  in the last 168 hours BNP: No components found with this basename: POCBNP,  CBG: No results found for this basename: GLUCAP,  in the last 168 hours D-Dimer No results found for this basename: DDIMER,  in the last 72 hours Hgb A1c No results found for this basename: HGBA1C,  in the last 72 hours Lipid Profile No results found for this basename: CHOL, HDL, LDLCALC, TRIG, CHOLHDL, LDLDIRECT,  in the last 72 hours Thyroid function studies No results found for  this basename: TSH, T4TOTAL, FREET3, T3FREE, THYROIDAB,  in the last 72 hours Anemia work up No results found for this basename: VITAMINB12, FOLATE, FERRITIN, TIBC, IRON, RETICCTPCT,  in the last 72 hours Microbiology No results found for this or any previous visit (from the past 240 hour(s)).    Studies:  No results found.  Assessment: 52 y.o. [name] woman admitted with intractable nausea  1. Rectal cancer being treated with neoadjuvant Xeloda and radiation, started on 06/09/2012  2. Intractable nausea--persistent despite multiple interventions 3. Dysuria  4. ?dystonic reaction related to IV Phenergan 5 diarrhea 6 malnutrition  Plan: patient's mother would like to discuss TNA as a bridge to allow patient to complete her  treatments--I referred that to Dr Truett Perna, but explained frequently the "food by vein" feeds the cancer more than the patient and we are cautious using it for that and other reasons.  I expleined that in my experience the patient's disabling nausea is very unusual and I would have a hard time relating it to her diagnosis or treatment, though of course that remains possible. Will obtain a brain MRI to make sure we do not have a meningioma or other not-cancer-related cause that might explain the problem--discussed with patient.  I suggested adding elavil, which has many uses including some anti-nausea effects--order entered.  The patient is not ready for d/c home. Will continue hydration, will recheck labs in AM   [facility] C 07/04/2012

## 2012-07-05 ENCOUNTER — Ambulatory Visit: Payer: BC Managed Care – PPO

## 2012-07-05 ENCOUNTER — Telehealth: Payer: Self-pay

## 2012-07-05 LAB — CBC WITH DIFFERENTIAL/PLATELET
Basophils Absolute: 0 10*3/uL (ref 0.0–0.1)
Basophils Relative: 1 % (ref 0–1)
Eosinophils Absolute: 0.9 10*3/uL — ABNORMAL HIGH (ref 0.0–0.7)
Eosinophils Relative: 21 % — ABNORMAL HIGH (ref 0–5)
HCT: 30.1 % — ABNORMAL LOW (ref 36.0–46.0)
Hemoglobin: 11 g/dL — ABNORMAL LOW (ref 12.0–15.0)
MCH: 31.3 pg (ref 26.0–34.0)
MCHC: 36.5 g/dL — ABNORMAL HIGH (ref 30.0–36.0)
Monocytes Absolute: 0.7 10*3/uL (ref 0.1–1.0)
Monocytes Relative: 17 % — ABNORMAL HIGH (ref 3–12)
Neutro Abs: 2.4 10*3/uL (ref 1.7–7.7)
RDW: 13.6 % (ref 11.5–15.5)

## 2012-07-05 LAB — COMPREHENSIVE METABOLIC PANEL
AST: 16 U/L (ref 0–37)
Albumin: 2.9 g/dL — ABNORMAL LOW (ref 3.5–5.2)
BUN: 4 mg/dL — ABNORMAL LOW (ref 6–23)
Calcium: 8.3 mg/dL — ABNORMAL LOW (ref 8.4–10.5)
Chloride: 106 mEq/L (ref 96–112)
Creatinine, Ser: 0.58 mg/dL (ref 0.50–1.10)
Total Bilirubin: 0.3 mg/dL (ref 0.3–1.2)
Total Protein: 5.2 g/dL — ABNORMAL LOW (ref 6.0–8.3)

## 2012-07-05 MED ORDER — SODIUM CHLORIDE 0.9 % IV SOLN
INTRAVENOUS | Status: DC
  Administered 2012-07-05 – 2012-07-10 (×8): via INTRAVENOUS
  Filled 2012-07-05 (×23): qty 1000

## 2012-07-05 MED ORDER — LORAZEPAM 0.5 MG PO TABS
0.5000 mg | ORAL_TABLET | ORAL | Status: DC | PRN
  Administered 2012-07-05 – 2012-07-10 (×10): 0.5 mg via ORAL
  Filled 2012-07-05 (×11): qty 1

## 2012-07-05 NOTE — Telephone Encounter (Signed)
Radiation on hold today.Dr.Moody to go see patient this evening, as having continued nausea, vomiting and diarrhea (c.difficile positive).

## 2012-07-05 NOTE — Progress Notes (Signed)
IP PROGRESS NOTE  Subjective:   She developed diarrhea beginning on Saturday. This has persisted. The nausea is better when she takes Ativan.  Objective: Vital signs in last 24 hours: Blood pressure 124/62, pulse 65, temperature 98.5 F (36.9 C), temperature source Oral, resp. rate 16, height 5' 3.5" (1.613 m), weight 107 lb (48.535 kg), SpO2 100.00%.  Intake/Output from previous day: 04/20 0701 - 04/21 0700 In: 360 [P.O.:360] Out: -   Physical Exam:  HEENT: No thrush Lungs: Clear bilaterally Cardiac: Regular rate and rhythm Abdomen: Nontender Extremities: No leg edema Skin: Palms without erythema   Lab Results:  Recent Labs  07/02/12 1900 07/05/12 0409  WBC 4.5 4.3  HGB 11.4* 11.0*  HCT 30.9* 30.1*  PLT 153 183    BMET  Recent Labs  07/03/12 0408 07/05/12 0409  NA 136 137  K 3.8 2.8*  CL 105 106  CO2 26 23  GLUCOSE 91 98  BUN 7 4*  CREATININE 0.66 0.58  CALCIUM 8.2* 8.3*    Studies/Results: Mr Laqueta Jean HY Contrast  07/04/2012  *RADIOLOGY REPORT*  Clinical Data: 52 year old female with recent diagnosis of rectal cancer.  Unexplained refractory nausea and vomiting.  Staging.  MRI HEAD WITHOUT AND WITH CONTRAST  Technique:  Multiplanar, multiecho pulse sequences of the brain and surrounding structures were obtained according to standard protocol without and with intravenous contrast  Contrast: 11mL MULTIHANCE GADOBENATE DIMEGLUMINE 529 MG/ML IV SOLN  Comparison: PET CT 06/01/2012.  Findings: No abnormal enhancement identified.  No cerebral, brainstem or cerebellar edema. Wallace Cullens and white matter signal is within normal limits throughout the brain.  No restricted diffusion to suggest acute infarction.  No midline shift, ventriculomegaly, mass effect, evidence of mass lesion, extra-axial collection or acute intracranial hemorrhage. Cervicomedullary junction and pituitary are within normal limits. Major intracranial vascular flow voids are preserved.  Negative visible  cervical spine.  Mildly decreased in heterogeneous bone marrow T1 signal, favor treatment related.  Visualized orbit soft tissues are within normal limits.  Visualized paranasal sinuses and mastoids are clear.  Visualized internal auditory structures appear normal.  Negative scalp soft tissues.  IMPRESSION: Normal MRI appearance of the brain.  No metastatic disease identified.   Original Report Authenticated By: Erskine Speed, M.D.     Medications: I have reviewed the patient's current medications.  Assessment/Plan:  1. Rectal cancer-currently completing neoadjuvant Xeloda and radiation, started on 06/09/2012  2. Intractable nausea-? Related to treatment versus rectal cancer , the nausea predating the start of Xeloda/radiation, improved with Ativan 3. Dysuria -? Radiation cystitis, a urinalysis was unremarkable hospital admission 07/02/2012. 4. Involuntary movements noted while in radiation oncology 07/02/2012 and continuing into the p.m. on 07/02/2012, now resolved-? Dystonic reaction related to IV Phenergan. 5. Diarrhea-a stool sample on 07/04/2012 was positive for C. difficile. She is now on Flagyl. 6. hypokalemia-we will add potassium to the IV fluids today 7. Nutrition-we discussed placement of a PICC and TNA. I favor continuing an oral diet if tolerated. 8. DVT prophylaxis-Lovenox  Ms. Shiraishi continues to be symptomatic with nausea and now diarrhea. I doubt the C. difficile explains the nausea that has been present over the past month. We will try oral Ativan for relief of the nausea. I will discuss resuming Xeloda/radiation with Dr. Mitzi Hansen.  We will record for stool output today. The plan is to discharge her to home when she is tolerating a diet and the diarrhea has improved.        LOS: 3 days  Pio Eatherly  07/05/2012, 8:25 AM

## 2012-07-06 ENCOUNTER — Ambulatory Visit: Payer: BC Managed Care – PPO

## 2012-07-06 ENCOUNTER — Ambulatory Visit
Admission: RE | Admit: 2012-07-06 | Discharge: 2012-07-06 | Disposition: A | Discharge status: Home / Self Care | Payer: BC Managed Care – PPO | Source: Ambulatory Visit | Attending: Radiation Oncology | Admitting: Radiation Oncology

## 2012-07-06 ENCOUNTER — Telehealth: Payer: Self-pay | Admitting: *Deleted

## 2012-07-06 LAB — BASIC METABOLIC PANEL
GFR calc Af Amer: >90 mL/min (ref >90)
GFR calc non Af Amer: >90 mL/min (ref >90)
Glucose, Bld: 90 mg/dL (ref 70–99)
Potassium: 3 mEq/L — ABNORMAL LOW (ref 3.5–5.1)
Sodium: 139 mEq/L (ref 135–145)

## 2012-07-06 LAB — MAGNESIUM: Magnesium: 1.6 mg/dL (ref 1.5–2.5)

## 2012-07-06 MED ORDER — WHITE PETROLATUM GEL
Status: DC | PRN
  Administered 2012-07-06: 1 via TOPICAL
  Filled 2012-07-06 (×3): qty 5

## 2012-07-06 MED ORDER — PANTOPRAZOLE SODIUM 40 MG IV SOLR
40.0000 mg | Freq: Two times a day (BID) | INTRAVENOUS | Status: DC
  Filled 2012-07-06 (×2): qty 40

## 2012-07-06 MED ORDER — CAPECITABINE 500 MG PO TABS
1500.0000 mg | ORAL_TABLET | Freq: Every day | ORAL | Status: DC
  Administered 2012-07-06 – 2012-07-07 (×2): 1500 mg via ORAL
  Filled 2012-07-06 (×3): qty 3

## 2012-07-06 MED ORDER — POTASSIUM CHLORIDE CRYS ER 20 MEQ PO TBCR
20.0000 meq | EXTENDED_RELEASE_TABLET | Freq: Two times a day (BID) | ORAL | Status: DC
  Administered 2012-07-06 – 2012-07-07 (×2): 20 meq via ORAL
  Filled 2012-07-06 (×6): qty 1

## 2012-07-06 MED ORDER — CAPECITABINE 500 MG PO TABS
1000.0000 mg | ORAL_TABLET | Freq: Every day | ORAL | Status: DC
  Administered 2012-07-06: 1000 mg via ORAL
  Filled 2012-07-06 (×2): qty 2

## 2012-07-06 MED ORDER — PANTOPRAZOLE SODIUM 40 MG PO TBEC
40.0000 mg | DELAYED_RELEASE_TABLET | Freq: Two times a day (BID) | ORAL | Status: DC
  Administered 2012-07-07 – 2012-07-11 (×7): 40 mg via ORAL
  Filled 2012-07-06 (×12): qty 1

## 2012-07-06 MED ORDER — CAPECITABINE 500 MG PO TABS: 1500.0000 mg | ORAL_TABLET | Freq: Two times a day (BID) | ORAL | Status: DC

## 2012-07-06 NOTE — Progress Notes (Signed)
Pt experiencing nausea. Unable to eat at this time. She and her family were concerned about the amount she needed to eat prior to taking her capecitabine. I searched [employer] and called the pharmacist. Per pharmacist there is not a caloric intake requirement for this medication but the food helps the medication absorb more effectively. I relayed this information to patient and family. Antiemetic has already been given. I explained that, per pharmacist, any food is better than nothing so that if she was able to get some crackers down at some point to let me know and I would administer the medication. Will continue to monitor nausea and how it will affect her ability to take her evening medications.

## 2012-07-06 NOTE — Progress Notes (Signed)
IP PROGRESS NOTE  Subjective:   She continues to have diarrhea. The nausea persists. She now complains of mid and upper abdominal pain that is worse after eating. No emesis.  Objective: Vital signs in last 24 hours: Blood pressure 105/67, pulse 67, temperature 97.9 F (36.6 C), temperature source Oral, resp. rate 16, height 5' 3.5" (1.613 m), weight 107 lb (48.535 kg), SpO2 98.00%.  Intake/Output from previous day: 04/21 0701 - 04/22 0700 In: 1035 [P.O.:480; I.V.:555] Out: -   Physical Exam:  HEENT: No thrush Lungs: Clear bilaterally Cardiac: Regular rate and rhythm Abdomen: Tender in the mid upper abdomen and subxiphoid region. Extremities: No leg edema Skin: Palms without erythema   Lab Results:  Recent Labs  07/05/12 0409  WBC 4.3  HGB 11.0*  HCT 30.1*  PLT 183   ANC 2.4  BMET  Recent Labs  07/05/12 0409 07/06/12 0424  NA 137 139  K 2.8* 3.0*  CL 106 109  CO2 23 21  GLUCOSE 98 90  BUN 4* 5*  CREATININE 0.58 0.61  CALCIUM 8.3* 8.4    Studies/Results: [name] Contrast  07/04/2012  *RADIOLOGY REPORT*  Clinical Data: 52 year old female with recent diagnosis of rectal cancer.  Unexplained refractory nausea and vomiting.  Staging.  MRI HEAD WITHOUT AND WITH CONTRAST  Technique:  Multiplanar, multiecho pulse sequences of the brain and surrounding structures were obtained according to standard protocol without and with intravenous contrast  Contrast: 11mL MULTIHANCE GADOBENATE DIMEGLUMINE 529 MG/ML IV SOLN  Comparison: PET CT 06/01/2012.  Findings: No abnormal enhancement identified.  No cerebral, brainstem or cerebellar edema. Wallace Cullens and white matter signal is within normal limits throughout the brain.  No restricted diffusion to suggest acute infarction.  No midline shift, ventriculomegaly, mass effect, evidence of mass lesion, extra-axial collection or acute intracranial hemorrhage. Cervicomedullary junction and pituitary are within normal limits. Major  intracranial vascular flow voids are preserved.  Negative visible cervical spine.  Mildly decreased in heterogeneous bone marrow T1 signal, favor treatment related.  Visualized orbit soft tissues are within normal limits.  Visualized paranasal sinuses and mastoids are clear.  Visualized internal auditory structures appear normal.  Negative scalp soft tissues.  IMPRESSION: Normal MRI appearance of the brain.  No metastatic disease identified.   Original Report Authenticated By: Erskine Speed, M.D.     Medications: I have reviewed the patient's current medications.  Assessment/Plan:  1. Rectal cancer-currently completing neoadjuvant Xeloda and radiation, started on 06/09/2012  2. Intractable nausea-? Related to treatment,rectal cancer , or another GI diagnosis. the nausea predating the start of Xeloda/radiation, improved with Ativan 3. Dysuria -? Radiation cystitis, a urinalysis was unremarkable hospital admission 07/02/2012. 4. Involuntary movements noted while in radiation oncology 07/02/2012 and continuing into the p.m. on 07/02/2012, now resolved-? Dystonic reaction related to IV Phenergan. 5. Diarrhea-a stool sample on 07/04/2012 was positive for C. difficile. She started Flagyl 07/04/2012 6. hypokalemia-secondary to diarrhea, continue potassium replacement 7. Nutrition- she will continue an oral diet as tolerated 8. DVT prophylaxis-Lovenox 9. Abdominal pain-? Related to gastritis or peptic ulcer disease. I will begin a trial of Protonix. The patient and family request a gastroenterology consultation.  She continues to have diarrhea. Her clinical status and CBC appear adequate to resume Xeloda/radiation. I suspect the diarrhea is related to C. difficile infection as opposed to toxicity from Xeloda. She has no other signs/symptoms of Xeloda toxicity.  She will stay in the hospital until the nausea and diarrhea improve.  LOS: 4 days   Adit Riddles  07/06/2012, 8:09 AM

## 2012-07-06 NOTE — Telephone Encounter (Signed)
Called the floor and spoke with Livinia,RN, checking on status of patient, 'she can come or radiation therapy today, still on c-diff precautions, but "no diarrhea today", per Rn, thanked her and called tomotherapy ,spoke with Christy,therapist, she can have tx today 9:03 AM

## 2012-07-06 NOTE — Progress Notes (Signed)
Pharmacy: IV to PO Protonix  Patient has been receiving IV Protonix. Per Pharmacy and Therapeutics Committee, this patient meets criteria for auto conversion to PO Protonix:   Tolerating a diet of full liquids or better  Tolerating other medications via enteral route  No GIB  Nikkita Adeyemi PharmD  [phone] 04/14/2011 8:54 AM    

## 2012-07-07 ENCOUNTER — Ambulatory Visit: Payer: BC Managed Care – PPO

## 2012-07-07 ENCOUNTER — Ambulatory Visit
Admission: RE | Admit: 2012-07-07 | Discharge: 2012-07-07 | Disposition: A | Discharge status: Home / Self Care | Payer: BC Managed Care – PPO | Source: Ambulatory Visit | Attending: Radiation Oncology | Admitting: Radiation Oncology

## 2012-07-07 LAB — BASIC METABOLIC PANEL
GFR calc Af Amer: >90 mL/min (ref >90)
GFR calc non Af Amer: >90 mL/min (ref >90)
Glucose, Bld: 89 mg/dL (ref 70–99)
Potassium: 3.2 mEq/L — ABNORMAL LOW (ref 3.5–5.1)
Sodium: 137 mEq/L (ref 135–145)

## 2012-07-07 LAB — LIPASE, BLOOD: Lipase: 22 U/L (ref 11–59)

## 2012-07-07 MED ORDER — CAPECITABINE 500 MG PO TABS
1000.0000 mg | ORAL_TABLET | ORAL | Status: DC
  Administered 2012-07-07 – 2012-07-08 (×2): 1000 mg via ORAL
  Filled 2012-07-07 (×3): qty 2

## 2012-07-07 MED ORDER — CAPECITABINE 500 MG PO TABS
1500.0000 mg | ORAL_TABLET | ORAL | Status: DC
Start: 2012-07-08 — End: 2012-07-09
  Administered 2012-07-08: 1500 mg via ORAL
  Filled 2012-07-07 (×2): qty 3

## 2012-07-07 NOTE — Consult Note (Signed)
[facility] Consultation Note  Referring Provider: Dr. Brad Sherrill (Oncology) Primary Care Physician:  MCNEILL,WENDY, MD  Reason for Consultation:  Abdominal pain, nausea  HPI: Leah Le is a 51 y.o. female with newly diagnosed (March 2014) rectal cancer in midst of treatment with chemotherapy and radiation.  I was asked to see her for evaluation of abdominal pain.  For the past nearly two months, she has had escalating troubles with post-prandial epigastric pain, nausea, and early satiety.  Poor appetite, with seemingly weight loss and sitophobia.  No NSAIDs.  Symptoms well-predated her treatment with chemoradiative therapy.  Her perirectal pain, which ultimately was found to be due to her rectal cancer, has much improved with chemoradiative therapy.   Past Medical History  Diagnosis Date  . Allergy     seasonal  . Ovarian cyst   . Migraine     manages with advil migreaine , sleep and dark room.  . Complication of anesthesia   . PONV (postoperative nausea and vomiting)   . 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 Rectal bleeding     Past Surgical History  Procedure Laterality Date  . Breast enhancement surgery  2011  . Tummy tuck  2009  . Hernia repair      as an infant  . Examination under anesthesia N/A 05/13/2012    Procedure: EXAM UNDER ANESTHESIA;  Surgeon: Matthew K. Tsuei, MD;  Location: [facility] OR;  Service: General;  Laterality: N/A;  . Hemorrhoid surgery N/A 05/13/2012    Procedure: HEMORRHOIDECTOMY;  Surgeon: Matthew K. Tsuei, MD;  Location: [facility] OR;  Service: General;  Laterality: N/A;  biopsies of anal lesions  . Eus N/A 05/19/2012    Procedure: LOWER ENDOSCOPIC ULTRASOUND (EUS);  Surgeon: Kaysha Parsell, MD;  Location: [facility];  Service: Endoscopy;  Laterality: N/A;  . Colonoscopy N/A 05/19/2012    Procedure: COLONOSCOPY;  Surgeon: Florencia Zaccaro, MD;  Location: [facility];  Service: Endoscopy;  Laterality: N/A;  . Resection of anus  05/13/2012     adenocarcinoma    Prior to Admission medications   Medication Sig Start Date End Date Taking? Authorizing Provider  capecitabine (XELODA) 500 MG tablet Take #3 (1500 mg) po every am & #2 (1000 mg) po every pm = 2500 mg total daily dose on days of radiation only M-F Start 06/02/12 05/31/12  Yes Gary B Sherrill, MD  lidocaine (XYLOCAINE JELLY) 2 % jelly Apply to affected area daily prn 05/26/12 05/26/13 Yes Matthew K. Tsuei, MD  LORazepam (ATIVAN) 0.5 MG tablet Take 0.5 mg by mouth every 8 (eight) hours as needed for anxiety.   Yes Historical Provider, MD  polyethylene glycol (MIRALAX / GLYCOLAX) packet Take 17 g by mouth daily.   Yes Historical Provider, MD  predniSONE (DELTASONE) 10 MG tablet Take 1 tablet (10 mg total) by mouth daily with breakfast. 07/04/12   Gary B Sherrill, MD    Current Facility-Administered Medications  Medication Dose Route Frequency Provider Last Rate Last Dose  . acetaminophen (TYLENOL) tablet 650 mg  650 mg Oral Q6H PRN Gary B Sherrill, MD   650 mg at 07/04/12 1851   Or  . acetaminophen (TYLENOL) suppository 650 mg  650 mg Rectal Q6H PRN Gary B Sherrill, MD      . capecitabine (XELODA) tablet 1,000 mg  1,000 mg Oral Custom Cynthia Fleetwood, RPH      . [START ON 07/08/2012] capecitabine (XELODA) tablet 1,500 mg  1,500 mg Oral Custom Cynthia Fleetwood, RPH      .   enoxaparin (LOVENOX) injection 40 mg  40 mg Subcutaneous Q24H Gary B Sherrill, MD      . LORazepam (ATIVAN) injection 0.5 mg  0.5 mg Intravenous Q6H PRN Gary B Sherrill, MD   0.5 mg at 07/07/12 0810  . LORazepam (ATIVAN) tablet 0.5 mg  0.5 mg Oral Q4H PRN Gary B Sherrill, MD   0.5 mg at 07/06/12 0142  . metroNIDAZOLE (FLAGYL) tablet 500 mg  500 mg Oral TID Gustav C Magrinat, MD   500 mg at 07/07/12 1059  . oxyCODONE (Oxy IR/ROXICODONE) immediate release tablet 5 mg  5 mg Oral Q4H PRN Gary B Sherrill, MD      . pantoprazole (PROTONIX) EC tablet 40 mg  40 mg Oral BID AC Cynthia Fleetwood, RPH   40 mg at  07/07/12 0802  . predniSONE (DELTASONE) tablet 10 mg  10 mg Oral Q breakfast Gary B Sherrill, MD   10 mg at 07/07/12 0802  . sodium chloride 0.9 % 1,000 mL with potassium chloride 30 mEq infusion   Intravenous Continuous Gary B Sherrill, MD 75 mL/hr at 07/07/12 0334    . white petrolatum (VASELINE) gel   Topical PRN Gary B Sherrill, MD   1 application at 07/06/12 1039    Allergies as of 07/02/2012 - Review Complete 07/02/2012  Allergen Reaction Noted  . Phenergan (promethazine) Other (See Comments) 07/02/2012  . Tetanus toxoids Other (See Comments)   . Benadryl (diphenhydramine hcl) Palpitations 05/18/2012    Family History  Problem Relation Age of Onset  . Other Father     amyloidosis    History   Social History  . Marital Status: Married    Spouse Name: N/A    Number of Children: 6  . Years of Education: N/A   Occupational History  .  [employer]   Social History Main Topics  . Smoking status: Never Smoker   . Smokeless tobacco: Never Used  . Alcohol Use: 0.6 oz/week    1 Glasses of wine per week     Comment: occasionally  . Drug Use: No  . Sexually Active: Not on file   Other Topics Concern  . Not on file   Social History Narrative  . No narrative on file    Review of Systems:  ROS 07/02/12 Dr. Sherrill reviewed and I agree  Physical Exam: Vital signs in last 24 hours: Temp:  [97.7 F (36.5 C)-98.2 F (36.8 C)] 97.7 F (36.5 C) (04/23 0451) Pulse Rate:  [58-78] 78 (04/23 0451) Resp:  [16] 16 (04/23 0451) BP: (106-119)/(58-65) 106/65 mmHg (04/23 0451) SpO2:  [98 %-100 %] 98 % (04/23 0451) Last BM Date: 07/06/12 General:   Alert,  Well-developed, well-nourished, pleasant and cooperative in NAD Head:  Normocephalic and atraumatic. Eyes:  Sclera clear, no icterus.   Conjunctiva pink. Ears:  Normal auditory acuity. Nose:  No deformity, discharge,  or lesions. Mouth:  No deformity or lesions.  Oropharynx pink & moist. Neck:   Supple; no masses or thyromegaly. Lungs:  Clear throughout to auscultation.   No wheezes, crackles, or rhonchi. No acute distress. Heart:  Regular rate and rhythm; no murmurs, clicks, rubs,  or gallops. Abdomen:  Soft, mild epigastric and periumbilical tenderness to mild-to-moderate palpation; nondistended. No masses, hepatosplenomegaly or hernias noted. Normal bowel sounds, without guarding, and without rebound.     Msk:  Symmetrical without gross deformities. Normal posture. Pulses:  Normal pulses noted. Extremities:  Without clubbing or edema. Neurologic:  Alert   and  oriented x4;  grossly normal neurologically. Skin:  Intact without significant lesions or rashes. Psych:  Alert and cooperative. Normal mood and affect.   Lab Results:  Recent Labs  07/05/12 0409  WBC 4.3  HGB 11.0*  HCT 30.1*  PLT 183   BMET  Recent Labs  07/05/12 0409 07/06/12 0424 07/07/12 0425  NA 137 139 137  K 2.8* 3.0* 3.2*  CL 106 109 108  CO2 23 21 21  GLUCOSE 98 90 89  BUN 4* 5* 6  CREATININE 0.58 0.61 0.59  CALCIUM 8.3* 8.4 8.2*   LFT  Recent Labs  07/05/12 0409  PROT 5.2*  ALBUMIN 2.9*  AST 16  ALT 12  ALKPHOS 39  BILITOT 0.3   PT/INR No results found for this basename: LABPROT, INR,  in the last 72 hours  Studies/Results: No results found.  Impression:  1.  Epigastric post-prandial abdominal pain. 2.  Early satiety. 3.  Nausea without vomiting. 4.  Rectal cancer.  Plan:  1.  Continue pantoprazole and antiemetics. 2.  Endoscopy tomorrow. 3.  If endoscopy is unrevealing, consider gastric emptying study +/- abdominal ultrasound. 4.  Case discussed with Dr. Sherrill.   LOS: 5 days   Akansha Wyche M  07/07/2012, 1:14 PM   

## 2012-07-07 NOTE — Progress Notes (Signed)
IP PROGRESS NOTE  Subjective:   She continues to have diarrhea, but this appears improved overnight. She complains of pain in the subxiphoid region that is sometimes exacerbated by eating. She has intermittent nausea. No back pain. No emesis.  Objective: Vital signs in last 24 hours: Blood pressure 106/65, pulse 78, temperature 97.7 F (36.5 C), temperature source Oral, resp. rate 16, height 5' 3.5" (1.613 m), weight 107 lb (48.535 kg), SpO2 98.00%.  Intake/Output from previous day: 04/22 0701 - 04/23 0700 In: 480 [P.O.:480] Out: -   Physical Exam:  HEENT: No thrush Lungs: Clear bilaterally Cardiac: Regular rate and rhythm Abdomen: Tender in the mid upper abdomen and subxiphoid region. Extremities: No leg edema Skin: Palms without erythema   Lab Results:  Recent Labs  07/05/12 0409  WBC 4.3  HGB 11.0*  HCT 30.1*  PLT 183   ANC 2.4  BMET  Recent Labs  07/06/12 0424 07/07/12 0425  NA 139 137  K 3.0* 3.2*  CL 109 108  CO2 21 21  GLUCOSE 90 89  BUN 5* 6  CREATININE 0.61 0.59  CALCIUM 8.4 8.2*    Studies/Results: No results found.  Medications: I have reviewed the patient's current medications.  Assessment/Plan:  1. Rectal cancer-currently completing neoadjuvant Xeloda and radiation, started on 06/09/2012 , treatment was resumed on 07/06/2012 2. Intractable nausea-? Related to treatment,rectal cancer , or another GI diagnosis. the nausea predating the start of Xeloda/radiation, improved with Ativan. 3. Dysuria -? Radiation cystitis, a urinalysis was unremarkable hospital admission 07/02/2012. 4. Involuntary movements noted while in radiation oncology 07/02/2012 and continuing into the p.m. on 07/02/2012, now resolved-? Dystonic reaction related to IV Phenergan. 5. Diarrhea-a stool sample on 07/04/2012 was positive for C. difficile. She started Flagyl 07/04/2012 6. hypokalemia-secondary to diarrhea, continue potassium replacement, improved 7. Nutrition- she  will continue an oral diet as tolerated 8. DVT prophylaxis-Lovenox 9. Abdominal pain-? Related to gastritis or peptic ulcer disease. Protonix was ordered on 07/06/2012, but was not started. We will check a lipase level today. Dr. Dulce Sellar has been consulted.  She continues to have nausea and now complains of upper abdominal pain. The etiology of her symptoms remains unclear. We will check a lipase today. Protonix will be started. Her case was presented at the GI tumor conference earlier today. Review of the staging CT reveals no explanation for the nausea or pain. She may need an upper endoscopy. The surgeons felt a HIDA scan may also be indicated.  She will stay in the hospital until the nausea and diarrhea improve.        LOS: 5 days   Leah Le, Leah Le  07/07/2012, 9:58 AM

## 2012-07-07 NOTE — Care Management (Signed)
Completed chart review. Utilization review updated per continued stay criteria.   Sharmon Leyden, RN ,BSN  Case Management.

## 2012-07-07 NOTE — Progress Notes (Signed)
Patient ID: Leah Le, female   DOB: 08/21/1960, 52 y.o.   MRN: [id number]  Spoke with the patient this afternoon.  Currently with minimal symptoms.  Rectal pain significantly improved with radiation.  Diarrhea from c. Diff seems to be slowing.  Pain is mostly in the epigastrium and is sporadic, but severe.  Not always associated with eating.  Agree with EGD tomorrow.  If no findings on EGD, would consider ultrasound or HIDA scan.    Wilmon Arms. Corliss Skains, MD, [facility]  07/07/2012 4:35 PM

## 2012-07-08 ENCOUNTER — Ambulatory Visit: Payer: BC Managed Care – PPO

## 2012-07-08 ENCOUNTER — Encounter (HOSPITAL_COMMUNITY)
Admission: AD | Disposition: A | Discharge status: Home / Self Care | Payer: Self-pay | Source: Ambulatory Visit | Attending: Oncology

## 2012-07-08 ENCOUNTER — Encounter (HOSPITAL_COMMUNITY): Payer: Self-pay | Admitting: *Deleted

## 2012-07-08 ENCOUNTER — Ambulatory Visit
Admission: RE | Admit: 2012-07-08 | Discharge: 2012-07-08 | Disposition: A | Discharge status: Home / Self Care | Payer: BC Managed Care – PPO | Source: Ambulatory Visit | Attending: Radiation Oncology | Admitting: Radiation Oncology

## 2012-07-08 HISTORY — PX: ESOPHAGOGASTRODUODENOSCOPY: SHX5428

## 2012-07-08 LAB — BASIC METABOLIC PANEL
GFR calc Af Amer: >90 mL/min (ref >90)
GFR calc non Af Amer: >90 mL/min (ref >90)
Potassium: 3.3 mEq/L — ABNORMAL LOW (ref 3.5–5.1)
Sodium: 140 mEq/L (ref 135–145)

## 2012-07-08 SURGERY — EGD (ESOPHAGOGASTRODUODENOSCOPY)
Anesthesia: Moderate Sedation | Laterality: Left

## 2012-07-08 MED ORDER — ENOXAPARIN SODIUM 30 MG/0.3ML ~~LOC~~ SOLN
40.0000 mg | SUBCUTANEOUS | Status: DC
  Filled 2012-07-08: qty 0.4

## 2012-07-08 MED ORDER — FENTANYL CITRATE 0.05 MG/ML IJ SOLN
INTRAMUSCULAR | Status: DC | PRN
  Administered 2012-07-08 (×4): 25 ug via INTRAVENOUS

## 2012-07-08 MED ORDER — BUTAMBEN-TETRACAINE-BENZOCAINE 2-2-14 % EX AERO
INHALATION_SPRAY | CUTANEOUS | Status: DC | PRN
  Administered 2012-07-08: 2 via TOPICAL

## 2012-07-08 MED ORDER — ONDANSETRON HCL 4 MG/2ML IJ SOLN
4.0000 mg | Freq: Three times a day (TID) | INTRAMUSCULAR | Status: DC
  Administered 2012-07-08: 4 mg via INTRAVENOUS
  Filled 2012-07-08 (×4): qty 2

## 2012-07-08 MED ORDER — MIDAZOLAM HCL 10 MG/2ML IJ SOLN
INTRAMUSCULAR | Status: DC | PRN
  Administered 2012-07-08 (×4): 2.5 mg via INTRAVENOUS

## 2012-07-08 MED ORDER — VANCOMYCIN 50 MG/ML ORAL SOLUTION
125.0000 mg | Freq: Four times a day (QID) | ORAL | Status: DC
  Administered 2012-07-08 – 2012-07-10 (×11): 125 mg via ORAL
  Filled 2012-07-08 (×16): qty 2.5

## 2012-07-08 MED ORDER — ENOXAPARIN SODIUM 40 MG/0.4ML ~~LOC~~ SOLN
40.0000 mg | SUBCUTANEOUS | Status: DC
  Filled 2012-07-08 (×4): qty 0.4

## 2012-07-08 NOTE — H&P (View-Only) (Signed)
[facility] Consultation Note  Referring Provider: Dr. Mancel Bale (Oncology) Primary Care Physician:  Gweneth Dimitri, MD  Reason for Consultation:  Abdominal pain, nausea  HPI: Leah Le is a 52 y.o. female with newly diagnosed (March 2014) rectal cancer in midst of treatment with chemotherapy and radiation.  I was asked to see her for evaluation of abdominal pain.  For the past nearly two months, she has had escalating troubles with post-prandial epigastric pain, nausea, and early satiety.  Poor appetite, with seemingly weight loss and sitophobia.  No NSAIDs.  Symptoms well-predated her treatment with chemoradiative therapy.  Her perirectal pain, which ultimately was found to be due to her rectal cancer, has much improved with chemoradiative therapy.   Past Medical History  Diagnosis Date  . Allergy     seasonal  . Ovarian cyst   . Migraine     manages with advil migreaine , sleep and dark room.  . Complication of anesthesia   . PONV (postoperative nausea and vomiting)   . 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 Rectal bleeding     Past Surgical History  Procedure Laterality Date  . Breast enhancement surgery  2011  . Tummy tuck  2009  . Hernia repair      as an infant  . Examination under anesthesia N/A 05/13/2012    Procedure: EXAM UNDER ANESTHESIA;  Surgeon: Wilmon Arms. Corliss Skains, MD;  Location: [facility] OR;  Service: General;  Laterality: N/A;  . Hemorrhoid surgery N/A 05/13/2012    Procedure: HEMORRHOIDECTOMY;  Surgeon: Wilmon Arms. Corliss Skains, MD;  Location: [facility] OR;  Service: General;  Laterality: N/A;  biopsies of anal lesions  . Eus N/A 05/19/2012    Procedure: LOWER ENDOSCOPIC ULTRASOUND (EUS);  Surgeon: Willis Modena, MD;  Location: [facility];  Service: Endoscopy;  Laterality: N/A;  . Colonoscopy N/A 05/19/2012    Procedure: COLONOSCOPY;  Surgeon: Willis Modena, MD;  Location: [facility];  Service: Endoscopy;  Laterality: N/A;  . Resection of anus  05/13/2012     adenocarcinoma    Prior to Admission medications   Medication Sig Start Date End Date Taking? Authorizing Provider  capecitabine (XELODA) 500 MG tablet Take #3 (1500 mg) po every am & #2 (1000 mg) po every pm = 2500 mg total daily dose on days of radiation only M-F Start 06/02/12 05/31/12  Yes Ladene Artist, MD  lidocaine (XYLOCAINE JELLY) 2 % jelly Apply to affected area daily prn 05/26/12 05/26/13 Yes Matthew K. Tsuei, MD  LORazepam (ATIVAN) 0.5 MG tablet Take 0.5 mg by mouth every 8 (eight) hours as needed for anxiety.   Yes Historical Provider, MD  polyethylene glycol (MIRALAX / GLYCOLAX) packet Take 17 g by mouth daily.   Yes Historical Provider, MD  predniSONE (DELTASONE) 10 MG tablet Take 1 tablet (10 mg total) by mouth daily with breakfast. 07/04/12   Ladene Artist, MD    Current Facility-Administered Medications  Medication Dose Route Frequency Provider Last Rate Last Dose  . acetaminophen (TYLENOL) tablet 650 mg  650 mg Oral Q6H PRN Ladene Artist, MD   650 mg at 07/04/12 1851   Or  . acetaminophen (TYLENOL) suppository 650 mg  650 mg Rectal Q6H PRN Ladene Artist, MD      . capecitabine (XELODA) tablet 1,000 mg  1,000 mg Oral Custom Gwen Her, [facility]      . [START ON 07/08/2012] capecitabine (XELODA) tablet 1,500 mg  1,500 mg Oral Custom Gwen Her, RPH      .  enoxaparin (LOVENOX) injection 40 mg  40 mg Subcutaneous Q24H Ladene Artist, MD      . LORazepam (ATIVAN) injection 0.5 mg  0.5 mg Intravenous Q6H PRN Ladene Artist, MD   0.5 mg at 07/07/12 0810  . LORazepam (ATIVAN) tablet 0.5 mg  0.5 mg Oral Q4H PRN Ladene Artist, MD   0.5 mg at 07/06/12 0142  . metroNIDAZOLE (FLAGYL) tablet 500 mg  500 mg Oral TID Lowella Dell, MD   500 mg at 07/07/12 1059  . oxyCODONE (Oxy IR/ROXICODONE) immediate release tablet 5 mg  5 mg Oral Q4H PRN Ladene Artist, MD      . pantoprazole (PROTONIX) EC tablet 40 mg  40 mg Oral BID AC Gwen Her, RPH   40 mg at  07/07/12 0802  . predniSONE (DELTASONE) tablet 10 mg  10 mg Oral Q breakfast Ladene Artist, MD   10 mg at 07/07/12 0802  . sodium chloride 0.9 % 1,000 mL with potassium chloride 30 mEq infusion   Intravenous Continuous Ladene Artist, MD 75 mL/hr at 07/07/12 0334    . white petrolatum (VASELINE) gel   Topical PRN Ladene Artist, MD   1 application at 07/06/12 1039    Allergies as of 07/02/2012 - Review Complete 07/02/2012  Allergen Reaction Noted  . Phenergan (promethazine) Other (See Comments) 07/02/2012  . Tetanus toxoids Other (See Comments)   . Benadryl (diphenhydramine hcl) Palpitations 05/18/2012    Family History  Problem Relation Age of Onset  . Other Father     amyloidosis    History   Social History  . Marital Status: Married    Spouse Name: N/A    Number of Children: 6  . Years of Education: N/A   Occupational History  .  [employer]   Social History Main Topics  . Smoking status: Never Smoker   . Smokeless tobacco: Never Used  . Alcohol Use: 0.6 oz/week    1 Glasses of wine per week     Comment: occasionally  . Drug Use: No  . Sexually Active: Not on file   Other Topics Concern  . Not on file   Social History Narrative  . No narrative on file    Review of Systems:  ROS 07/02/12 Dr. Truett Perna reviewed and I agree  Physical Exam: Vital signs in last 24 hours: Temp:  [97.7 F (36.5 C)-98.2 F (36.8 C)] 97.7 F (36.5 C) (04/23 0451) Pulse Rate:  [58-78] 78 (04/23 0451) Resp:  [16] 16 (04/23 0451) BP: (106-119)/(58-65) 106/65 mmHg (04/23 0451) SpO2:  [98 %-100 %] 98 % (04/23 0451) Last BM Date: 07/06/12 General:   Alert,  Well-developed, well-nourished, pleasant and cooperative in NAD Head:  Normocephalic and atraumatic. Eyes:  Sclera clear, no icterus.   Conjunctiva pink. Ears:  Normal auditory acuity. Nose:  No deformity, discharge,  or lesions. Mouth:  No deformity or lesions.  Oropharynx pink & moist. Neck:   Supple; no masses or thyromegaly. Lungs:  Clear throughout to auscultation.   No wheezes, crackles, or rhonchi. No acute distress. Heart:  Regular rate and rhythm; no murmurs, clicks, rubs,  or gallops. Abdomen:  Soft, mild epigastric and periumbilical tenderness to mild-to-moderate palpation; nondistended. No masses, hepatosplenomegaly or hernias noted. Normal bowel sounds, without guarding, and without rebound.     Msk:  Symmetrical without gross deformities. Normal posture. Pulses:  Normal pulses noted. Extremities:  Without clubbing or edema. Neurologic:  Alert  and  oriented x4;  grossly normal neurologically. Skin:  Intact without significant lesions or rashes. Psych:  Alert and cooperative. Normal mood and affect.   Lab Results:  Recent Labs  07/05/12 0409  WBC 4.3  HGB 11.0*  HCT 30.1*  PLT 183   BMET  Recent Labs  07/05/12 0409 07/06/12 0424 07/07/12 0425  NA 137 139 137  K 2.8* 3.0* 3.2*  CL 106 109 108  CO2 23 21 21   GLUCOSE 98 90 89  BUN 4* 5* 6  CREATININE 0.58 0.61 0.59  CALCIUM 8.3* 8.4 8.2*   LFT  Recent Labs  07/05/12 0409  PROT 5.2*  ALBUMIN 2.9*  AST 16  ALT 12  ALKPHOS 39  BILITOT 0.3   PT/INR No results found for this basename: LABPROT, INR,  in the last 72 hours  Studies/Results: No results found.  Impression:  1.  Epigastric post-prandial abdominal pain. 2.  Early satiety. 3.  Nausea without vomiting. 4.  Rectal cancer.  Plan:  1.  Continue pantoprazole and antiemetics. 2.  Endoscopy tomorrow. 3.  If endoscopy is unrevealing, consider gastric emptying study +/- abdominal ultrasound. 4.  Case discussed with Dr. Truett Perna.   LOS: 5 days   Khristy Kalan M  07/07/2012, 1:14 PM

## 2012-07-08 NOTE — Interval H&P Note (Signed)
History and Physical Interval Note:  07/08/2012 8:24 AM  Leah Le  has presented today for surgery, with the diagnosis of epigastric abdominal pain, nausea, early satiety.  The various methods of treatment have been discussed with the patient and family. After consideration of risks, benefits and other options for treatment, the patient has consented to  Procedure(s): ESOPHAGOGASTRODUODENOSCOPY (EGD) (Left) as a surgical intervention .  The patient's history has been reviewed, patient examined, no change in status, stable for surgery.  I have reviewed the patient's chart and labs.  Questions were answered to the patient's satisfaction.     Maliik Karner M  Assessment:  1.  Epigastric pain, nausea, early satiety.  Plan:  1.  Endoscopy. 2.  Risks (bleeding, infection, bowel perforation that could require surgery, sedation-related changes in cardiopulmonary systems), benefits (identification and possible treatment of source of symptoms, exclusion of certain causes of symptoms), and alternatives (watchful waiting, radiographic imaging studies, empiric medical treatment) of upper endoscopy (EGD) were explained to patient in detail and patient wishes to proceed.

## 2012-07-08 NOTE — Progress Notes (Signed)
Pt came back from endoscopy asking for food in order to take her Xeloda. Md was paged and he was agreeable to advance her diet as long as she is not nauseas. Pt stated she is not nauseas at this time and will advance her diet to regular.

## 2012-07-08 NOTE — Progress Notes (Signed)
Patient refused Lovenox. Still reporting 12 stools today since 0430 but decreased amount with each stool per patient.

## 2012-07-08 NOTE — Plan of Care (Signed)
Problem: Phase II Progression Outcomes Goal: Progress activity as tolerated unless otherwise ordered Outcome: Progressing Patient ambulated in hallway with son tonight.

## 2012-07-08 NOTE — Op Note (Signed)
[facility] [address] [place], [address]   ENDOSCOPY PROCEDURE REPORT  PATIENT: Leah Le, Leah Le  MR#: [id number] BIRTHDATE: [birth year] , 51  yrs. old GENDER: Female ENDOSCOPIST: Willis Modena, MD REFERRED BY:  Mardelle Matte, M.D. PROCEDURE DATE:  07/08/2012 PROCEDURE:  EGD, diagnostic ASA CLASS:     Class III INDICATIONS:  epigastric abdominal pain, early satiety, nausea. MEDICATIONS: Fentanyl 100 mcg IV and Versed 10 mg IV TOPICAL ANESTHETIC: Cetacaine Spray  DESCRIPTION OF PROCEDURE: After the risks benefits and alternatives of the procedure were thoroughly explained, informed consent was obtained.  The Pentax Gastroscope [id number] endoscope was introduced through the mouth and advanced to the second portion of the duodenum. Without limitations.  The instrument was slowly withdrawn as the mucosa was fully examined.    Findings:  Normal esophagus.  Large amounts of retained gastric contents, limiting some views of fundus and body of stomach. Normal pylorus without gastric outlet obstruction.  Mild proximal gastritis.  Stomach otherwise normal.  Normal duodenum to the second portion.            The scope was then withdrawn from the patient and the procedure completed.  ENDOSCOPIC IMPRESSION:     As above.  Overall constellation of symptoms and endoscopic findings are most supportive of gastroparesis.  However, more downstream obstruction (in the small bowel) can not be completely excluded (but is not suspected on her CT from about one month ago).  RECOMMENDATIONS:     1.  Watch for potential complications of procedure. 2.  Upper GI series with small bowel follow-through, to better exclude downstream obstruction. 3.  If UGI/SBFT is unrevealing re: small bowel narrowing, etc, as I suspect it will be, will focus on symptom-based management of gastroparesis. 4.  Small, frequent meals. 5.  Minimize fibrous meats, breads, vegetables. 6.  Patient is  not good candidate for metoclopramide, given the potential extrapyramidal symptoms incurred with promethazine. 7.  Will follow.  eSigned:  Willis Modena, MD 07/08/2012 8:55 AM   CC:

## 2012-07-08 NOTE — Progress Notes (Signed)
IP PROGRESS NOTE  Subjective:   She continues to have diarrhea, but the volume has diminished. The upper abdominal pain and nausea persist. No apparent improvement with Protonix.  Objective: Vital signs in last 24 hours: Blood pressure 109/75, pulse 63, temperature 97.5 F (36.4 C), temperature source Oral, resp. rate 14, height 5' 3.5" (1.613 m), weight 107 lb (48.535 kg), SpO2 100.00%.  Intake/Output from previous day: 04/23 0701 - 04/24 0700 In: 1619.3 [P.O.:600; I.V.:1019.3] Out: -   Physical Exam:  HEENT: No thrush Lungs: Clear bilaterally Cardiac: Regular rate and rhythm Abdomen: Tender in the mid upper abdomen and subxiphoid region. Extremities: No leg edema Skin: Palms without erythema   Lab Results: No results found for this basename: WBC, HGB, HCT, PLT,  in the last 72 hours ANC 2.4  BMET  Recent Labs  07/07/12 0425 07/08/12 0458  NA 137 140  K 3.2* 3.3*  CL 108 108  CO2 21 22  GLUCOSE 89 94  BUN 6 6  CREATININE 0.59 0.68  CALCIUM 8.2* 8.4    Studies/Results: No results found.  Medications: I have reviewed the patient's current medications.  Assessment/Plan:  1. Rectal cancer-currently completing neoadjuvant Xeloda and radiation, started on 06/09/2012 , treatment was resumed on 07/06/2012 2. Intractable nausea-? Related to treatment,rectal cancer , or another GI diagnosis. the nausea predating the start of Xeloda/radiation, improved with Ativan. 3. Dysuria -? Radiation cystitis, a urinalysis was unremarkable hospital admission 07/02/2012. 4. Involuntary movements noted while in radiation oncology 07/02/2012 and continuing into the p.m. on 07/02/2012, now resolved-? Dystonic reaction related to IV Phenergan. 5. Diarrhea-a stool sample on 07/04/2012 was positive for C. difficile. She started Flagyl 07/04/2012, consider a switch to oral vancomycin if the diarrhea is not improved over the next few days 6. hypokalemia-secondary to diarrhea, continue  potassium replacement, improved 7. Nutrition- she will continue an oral diet as tolerated 8. DVT prophylaxis-Lovenox 9. Abdominal pain-? Etiology, she is scheduled for an upper endoscopy today.    She continues to have nausea and upper abdominal pain. The etiology of her symptoms remains unclear. If the upper endoscopy is nondiagnostic we will need to consider additional imaging . It is possible the rectal cancer has progressed and is responsible for the pain/nausea. I consider the addition of Reglan, but this has not been started secondary to the Phenergan reaction 07/02/2012. I will discuss this with the cancer Center pharmacy.  She will stay in the hospital until the nausea and diarrhea improve.  I will be out until 07/04/2012. Dr. Myna Hidalgo will see her starting 07/09/2012.        LOS: 6 days   Gearl Baratta, [name]  07/08/2012, 12:55 PM

## 2012-07-09 ENCOUNTER — Ambulatory Visit: Payer: BC Managed Care – PPO

## 2012-07-09 ENCOUNTER — Encounter (HOSPITAL_COMMUNITY): Payer: Self-pay | Admitting: Gastroenterology

## 2012-07-09 ENCOUNTER — Inpatient Hospital Stay (HOSPITAL_COMMUNITY): Payer: BC Managed Care – PPO

## 2012-07-09 ENCOUNTER — Ambulatory Visit
Admission: RE | Admit: 2012-07-09 | Discharge: 2012-07-09 | Disposition: A | Discharge status: Home / Self Care | Payer: BC Managed Care – PPO | Source: Ambulatory Visit | Attending: Radiation Oncology | Admitting: Radiation Oncology

## 2012-07-09 DIAGNOSIS — K3184 Gastroparesis: Principal | ICD-10-CM

## 2012-07-09 DIAGNOSIS — A0472 Enterocolitis due to Clostridium difficile, not specified as recurrent: Secondary | ICD-10-CM

## 2012-07-09 DIAGNOSIS — R109 Unspecified abdominal pain: Secondary | ICD-10-CM

## 2012-07-09 DIAGNOSIS — E876 Hypokalemia: Secondary | ICD-10-CM

## 2012-07-09 LAB — BASIC METABOLIC PANEL
Calcium: 8.2 mg/dL — ABNORMAL LOW (ref 8.4–10.5)
GFR calc Af Amer: >90 mL/min (ref >90)
GFR calc non Af Amer: >90 mL/min (ref >90)
Glucose, Bld: 88 mg/dL (ref 70–99)
Potassium: 3 mEq/L — ABNORMAL LOW (ref 3.5–5.1)
Sodium: 136 mEq/L (ref 135–145)

## 2012-07-09 LAB — CBC WITH DIFFERENTIAL/PLATELET
Hemoglobin: 10.5 g/dL — ABNORMAL LOW (ref 12.0–15.0)
Lymphocytes Relative: 9 % — ABNORMAL LOW (ref 12–46)
Lymphs Abs: 0.2 10*3/uL — ABNORMAL LOW (ref 0.7–4.0)
Monocytes Relative: 14 % — ABNORMAL HIGH (ref 3–12)
Neutro Abs: 1.6 10*3/uL — ABNORMAL LOW (ref 1.7–7.7)
Neutrophils Relative %: 57 % (ref 43–77)
Platelets: 186 10*3/uL (ref 150–400)
RBC: 3.33 MIL/uL — ABNORMAL LOW (ref 3.87–5.11)
WBC: 2.7 10*3/uL — ABNORMAL LOW (ref 4.0–10.5)

## 2012-07-09 MED ORDER — SACCHAROMYCES BOULARDII 250 MG PO CAPS
500.0000 mg | ORAL_CAPSULE | Freq: Two times a day (BID) | ORAL | Status: DC
  Administered 2012-07-09 – 2012-07-10 (×3): 500 mg via ORAL
  Filled 2012-07-09 (×5): qty 2

## 2012-07-09 MED ORDER — POTASSIUM CHLORIDE 10 MEQ/100ML IV SOLN
10.0000 meq | INTRAVENOUS | Status: DC
  Administered 2012-07-09: 10 meq via INTRAVENOUS
  Filled 2012-07-09 (×4): qty 100

## 2012-07-09 MED ORDER — POTASSIUM CHLORIDE 20 MEQ/15ML (10%) PO LIQD
20.0000 meq | Freq: Three times a day (TID) | ORAL | Status: DC
  Administered 2012-07-09 – 2012-07-10 (×5): 20 meq via ORAL
  Filled 2012-07-09 (×8): qty 15

## 2012-07-09 MED ORDER — METOCLOPRAMIDE HCL 5 MG/ML IJ SOLN
5.0000 mg | Freq: Three times a day (TID) | INTRAMUSCULAR | Status: DC
  Administered 2012-07-09 – 2012-07-10 (×5): 5 mg via INTRAVENOUS
  Filled 2012-07-09: qty 2
  Filled 2012-07-09: qty 1
  Filled 2012-07-09 (×3): qty 2
  Filled 2012-07-09: qty 1
  Filled 2012-07-09: qty 2
  Filled 2012-07-09 (×3): qty 1

## 2012-07-09 NOTE — Progress Notes (Signed)
Incredibly complicated situation. Has multiple  problems. Found have gastroparesis on upper endoscopy. There is some concern about her being "allergic" to Phenergan. She's been taking Phenergan for a long time. She got extra dose recently which may have caused these "allergic symptoms". I personally feel that she can take Reglan. We will start her on a low dose of Reglan and see how she does.  I know that erythromycin is also used for gastroparesis. I will need to rely on gastroenterology his expertise to see if this may help her.  She really has no bowel pain this morning. After Dr. Dulce Sellar to the upper endoscopy and removed the gastric contents, she felt a lot better.  She is going for small bowel follow-through this morning. I got to believe that this is go be negative.  I'm going to stop her Xeloda. I don't think this is mandatory that she take this right now. She has 11 treatments of radiation to go. Being off Xeloda right now is not going to put her at a higher risk. she already has stage IV disease what I can tell by her PET scan.  Her potassium is 3.0. I will give her some extra potassium runs. She or he has 30 mEq in her IV fluid.  Pain has not been much of a problem for her.  She also has a C. difficile. She is on oral vancomycin. She no longer has diarrhea.  On her physical exam  Her vital signs are all stable.her head exam shows no ocular or oral lesions. There is no thrush. Her lungs are clear. Cardiac exam regular rate and rhythm with a normal S1 -- 2. Abdomen is soft. Bowel sounds are slightly decreased. She has no guarding or rebound tenderness. No abdominal masses noted. There is no palpable hepatospleno megaly extremities shows no clubbing cyanosis or edema.  Her CBC is okay.  I think we need to focus on her GI symptoms. We will see what the small bowel follow-through shows. I still feel that Reglan is going to be tolerated. We need to replace her potassium.  We will hold the  Xeloda for right now.  We did have an excellent prayer session today. We know that God will take care of her and will heal her.   Dario Ave 17:14

## 2012-07-09 NOTE — Progress Notes (Signed)
UGI/SBFT neg for distal obstrn.  Pt and mother instructed in principles of gastroparesis diet.  No objection to trial of Reglan, since it appears that her reaction to Phenergan was overstated.  However, on general principles, if the patient starts doing better, would try to wean her off Reglan so as to avoid potential long-term side effects such as choreoathetotic movement disorder.  Would avoid erythromycin for now, since she is recovering from C. Difficile infection; however, we could consider it down the road if her symptoms are persistent and refractory.  Have ordered FloraStor probiotic as adjunctive therapy for her C. Difficile infection.  We will follow with you at a distance; call us anytime if more immediate input is desired.  Florencia Reasons, M.D. [phone]

## 2012-07-10 LAB — BASIC METABOLIC PANEL
BUN: 3 mg/dL — ABNORMAL LOW (ref 6–23)
Chloride: 102 mEq/L (ref 96–112)
GFR calc non Af Amer: >90 mL/min (ref >90)
Glucose, Bld: 94 mg/dL (ref 70–99)
Potassium: 3.5 mEq/L (ref 3.5–5.1)
Sodium: 137 mEq/L (ref 135–145)

## 2012-07-10 LAB — CBC
HCT: 31.5 % — ABNORMAL LOW (ref 36.0–46.0)
Hemoglobin: 11.5 g/dL — ABNORMAL LOW (ref 12.0–15.0)
MCH: 31.8 pg (ref 26.0–34.0)
MCHC: 36.5 g/dL — ABNORMAL HIGH (ref 30.0–36.0)
RBC: 3.62 MIL/uL — ABNORMAL LOW (ref 3.87–5.11)

## 2012-07-10 MED ORDER — METOCLOPRAMIDE HCL 5 MG/ML IJ SOLN
10.0000 mg | Freq: Three times a day (TID) | INTRAMUSCULAR | Status: DC
  Administered 2012-07-10 – 2012-07-11 (×4): 10 mg via INTRAVENOUS
  Filled 2012-07-10 (×5): qty 2

## 2012-07-10 MED ORDER — SUCRALFATE 1 GM/10ML PO SUSP
1.0000 g | Freq: Three times a day (TID) | ORAL | Status: DC
Start: 2012-07-10 — End: 2012-07-11
  Administered 2012-07-10 – 2012-07-11 (×4): 1 g via ORAL
  Filled 2012-07-10 (×8): qty 10

## 2012-07-10 MED ORDER — PANCRELIPASE (LIP-PROT-AMYL) 12000-38000 UNITS PO CPEP
2.0000 | ORAL_CAPSULE | Freq: Three times a day (TID) | ORAL | Status: DC
Start: 2012-07-10 — End: 2012-07-11
  Administered 2012-07-10 – 2012-07-11 (×3): 2 via ORAL
  Filled 2012-07-10 (×6): qty 2

## 2012-07-10 NOTE — Progress Notes (Signed)
Her upper GI was normal. No blockages were seen.  She started having abdominal pain last night. She is on a full liquid diet. I'm going to try her on some Carafate. Maybe this can help with her stomach.  I wonder if she may have some pancreatic issues. I don't see any problems with try her on some pancreatic enzyme replacement.  We will see what her lab work is today.  I very much appreciate GIs help. They have been very instrumental in trying to get her feeling better. Her daughter is getting married next week. She must make it to the wedding.  She is just getting frustrated given all the issues that are ongoing.  She's had absolutely no problems with Reglan. We will try to increase the dose to 10 mg every 6 hours. We have to be careful with her diarrhea. She needs to have a loose stools because of the rectal tumor.  I've held her Xeloda right now. I don't think this is really mandatory in view of her symptoms.  Her vital signs are stable. Blood pressure 103/60. Her abdomen is soft. She has good bowel sounds. There is no guarding or rebound tenderness. Lungs are clear. Cardiac exam regular rate and rhythm.  Possibly, discharge tomorrow could be entertained if she does okay today.  We have to see what her potassium is. She is on potassium replacement which she may need to be on with the diarrhea.  We will continue pray hard for her.  Hewitt Shorts  Psalm 105:4

## 2012-07-11 LAB — CBC
Hemoglobin: 11.6 g/dL — ABNORMAL LOW (ref 12.0–15.0)
MCH: 31.3 pg (ref 26.0–34.0)
MCHC: 35.9 g/dL (ref 30.0–36.0)
MCV: 87.1 fL (ref 78.0–100.0)
RBC: 3.71 MIL/uL — ABNORMAL LOW (ref 3.87–5.11)

## 2012-07-11 LAB — BASIC METABOLIC PANEL
BUN: <3 mg/dL — ABNORMAL LOW (ref 6–23)
CO2: 23 mEq/L (ref 19–32)
Calcium: 8.5 mg/dL (ref 8.4–10.5)
GFR calc non Af Amer: >90 mL/min (ref >90)
Glucose, Bld: 98 mg/dL (ref 70–99)
Potassium: 3.9 mEq/L (ref 3.5–5.1)
Sodium: 135 mEq/L (ref 135–145)

## 2012-07-11 MED ORDER — VANCOMYCIN 50 MG/ML ORAL SOLUTION: 125.0000 mg | Freq: Four times a day (QID) | ORAL | Status: DC

## 2012-07-11 MED ORDER — POTASSIUM CHLORIDE CRYS ER 20 MEQ PO TBCR
20.0000 meq | EXTENDED_RELEASE_TABLET | Freq: Two times a day (BID) | ORAL | Status: DC
  Filled 2012-07-11 (×2): qty 1

## 2012-07-11 MED ORDER — SACCHAROMYCES BOULARDII 250 MG PO CAPS: 500.0000 mg | ORAL_CAPSULE | Freq: Two times a day (BID) | ORAL | Status: DC

## 2012-07-11 MED ORDER — PANCRELIPASE (LIP-PROT-AMYL) 12000-38000 UNITS PO CPEP: 2.0000 | ORAL_CAPSULE | Freq: Three times a day (TID) | ORAL | Status: DC

## 2012-07-11 MED ORDER — SUCRALFATE 1 GM/10ML PO SUSP: 1.0000 g | Freq: Three times a day (TID) | ORAL | Status: DC

## 2012-07-11 MED ORDER — POTASSIUM CHLORIDE CRYS ER 20 MEQ PO TBCR: 20.0000 meq | EXTENDED_RELEASE_TABLET | Freq: Every day | ORAL | Status: DC

## 2012-07-11 MED ORDER — METOCLOPRAMIDE HCL 10 MG PO TABS: 10.0000 mg | ORAL_TABLET | Freq: Three times a day (TID) | ORAL | Status: DC

## 2012-07-11 NOTE — Progress Notes (Signed)
Pt is to be discharged home today. Pt is in NAD, IV is out, all paperwork has been reviewed/discussed with patient, and there are no questions/concerns at this time. Assessment is unchanged from this morning. Pt is to be accompanied downstairs by staff and family via wheelchair.  

## 2012-07-11 NOTE — Discharge Instructions (Signed)
Call for increased nausea or pain.  Stay off the Xeloda for now.  Dr. Conchita Paris let you know when to re-start this.  Drink!! Drink!! Drink!!  Go easy on the regular food.  NO greasy or spicy foods.  Eat small amounts frequently.

## 2012-07-12 ENCOUNTER — Ambulatory Visit (HOSPITAL_BASED_OUTPATIENT_CLINIC_OR_DEPARTMENT_OTHER): Payer: BC Managed Care – PPO | Admitting: Nurse Practitioner

## 2012-07-12 ENCOUNTER — Telehealth: Payer: Self-pay | Admitting: Oncology

## 2012-07-12 ENCOUNTER — Ambulatory Visit: Payer: BC Managed Care – PPO

## 2012-07-12 ENCOUNTER — Ambulatory Visit
Admission: RE | Admit: 2012-07-12 | Discharge: 2012-07-12 | Disposition: A | Discharge status: Home / Self Care | Payer: BC Managed Care – PPO | Source: Ambulatory Visit | Attending: Radiation Oncology | Admitting: Radiation Oncology

## 2012-07-12 ENCOUNTER — Other Ambulatory Visit: Payer: Self-pay | Admitting: Nurse Practitioner

## 2012-07-12 ENCOUNTER — Other Ambulatory Visit (HOSPITAL_BASED_OUTPATIENT_CLINIC_OR_DEPARTMENT_OTHER): Payer: BC Managed Care – PPO | Admitting: Lab

## 2012-07-12 VITALS — BP 117/80 | HR 96 | Temp 97.6°F | Resp 18 | Ht 63.5 in | Wt 97.7 lb

## 2012-07-12 DIAGNOSIS — R599 Enlarged lymph nodes, unspecified: Secondary | ICD-10-CM

## 2012-07-12 DIAGNOSIS — C20 Malignant neoplasm of rectum: Secondary | ICD-10-CM

## 2012-07-12 DIAGNOSIS — R110 Nausea: Secondary | ICD-10-CM

## 2012-07-12 DIAGNOSIS — G893 Neoplasm related pain (acute) (chronic): Secondary | ICD-10-CM

## 2012-07-12 LAB — CBC WITH DIFFERENTIAL/PLATELET
BASO%: 0.7 % (ref 0.0–2.0)
EOS%: 14.2 % — ABNORMAL HIGH (ref 0.0–7.0)
HCT: 38.8 % (ref 34.8–46.6)
LYMPH%: 2.4 % — ABNORMAL LOW (ref 14.0–49.7)
MCH: 30.8 pg (ref 25.1–34.0)
MCHC: 34.1 g/dL (ref 31.5–36.0)
MONO%: 14.1 % — ABNORMAL HIGH (ref 0.0–14.0)
NEUT%: 68.6 % (ref 38.4–76.8)
Platelets: 247 10*3/uL (ref 145–400)
RBC: 4.29 10*6/uL (ref 3.70–5.45)

## 2012-07-12 LAB — BASIC METABOLIC PANEL (CC13)
CO2: 24 mEq/L (ref 22–29)
Calcium: 9.2 mg/dL (ref 8.4–10.4)
Creatinine: 0.7 mg/dL (ref 0.6–1.1)
Sodium: 138 mEq/L (ref 136–145)

## 2012-07-12 MED ORDER — TEMAZEPAM 15 MG PO CAPS: 15.0000 mg | ORAL_CAPSULE | Freq: Every evening | ORAL | Status: DC | PRN

## 2012-07-12 NOTE — Progress Notes (Signed)
OFFICE PROGRESS NOTE  Interval history:  Leah Le returns as scheduled. She was hospitalized 07/02/2012 through 07/11/2012 initially with a possible dystonic reaction related to IV Phenergan. While in the hospital she developed diarrhea. A stool sample was positive for C. difficile. She was started on Flagyl and subsequently changed to oral vancomycin.  She underwent an upper endoscopy on 07/08/2012 for evaluation of abdominal pain, early satiety and nausea. Large amount of retained gastric contents was noted. There was mild proximal gastritis. The symptoms and endoscopic findings were felt to be most supportive of gastroparesis. Upper GI on 07/09/2012 showed no specific abnormality of the esophagus, stomach or small bowel. There was no significant delay in contrast flow into the duodenum. She was started on a trial of Reglan.  Xeloda was placed on hold during hospitalization.  She was discharged home on 07/11/2012.  She notes significant improvement in the nausea and abdominal pain. She continues Reglan. The diarrhea is better. She is having periodic small volume loose stools. She continues vancomycin. She feels "weak and tired". Pain at the rectum is better.   Objective: Blood pressure 117/80, pulse 96, temperature 97.6 F (36.4 C), temperature source Oral, resp. rate 18, height 5' 3.5" (1.613 m), weight 97 lb 11.2 oz (44.316 kg), last menstrual period 07/01/2012.  No thrush or ulceration. Lungs are clear. Regular cardiac rhythm. Abdomen is soft and nontender. No hepatomegaly. Extremities are without edema.  Lab Results: Lab Results  Component Value Date   WBC 4.8 07/12/2012   HGB 13.2 07/12/2012   HCT 38.8 07/12/2012   MCV 90.3 07/12/2012   PLT 247 07/12/2012    Chemistry:    Chemistry      Component Value Date/Time   NA 138 07/12/2012 1558   NA 135 07/11/2012 0438   K 3.5 07/12/2012 1558   K 3.9 07/11/2012 0438   CL 103 07/12/2012 1558   CL 105 07/11/2012 0438   CO2 24 07/12/2012  1558   CO2 23 07/11/2012 0438   BUN 4.6* 07/12/2012 1558   BUN <3* 07/11/2012 0438   CREATININE 0.7 07/12/2012 1558   CREATININE 0.52 07/11/2012 0438      Component Value Date/Time   CALCIUM 9.2 07/12/2012 1558   CALCIUM 8.5 07/11/2012 0438   ALKPHOS 39 07/05/2012 0409   AST 16 07/05/2012 0409   ALT 12 07/05/2012 0409   BILITOT 0.3 07/05/2012 0409       Studies/Results: Mr Leah Le Contrast  07/04/2012  *RADIOLOGY REPORT*  Clinical Data: 52 year old female with recent diagnosis of rectal cancer.  Unexplained refractory nausea and vomiting.  Staging.  MRI HEAD WITHOUT AND WITH CONTRAST  Technique:  Multiplanar, multiecho pulse sequences of the brain and surrounding structures were obtained according to standard protocol without and with intravenous contrast  Contrast: 11mL MULTIHANCE GADOBENATE DIMEGLUMINE 529 MG/ML IV SOLN  Comparison: PET CT 06/01/2012.  Findings: No abnormal enhancement identified.  No cerebral, brainstem or cerebellar edema. Wallace Cullens and white matter signal is within normal limits throughout the brain.  No restricted diffusion to suggest acute infarction.  No midline shift, ventriculomegaly, mass effect, evidence of mass lesion, extra-axial collection or acute intracranial hemorrhage. Cervicomedullary junction and pituitary are within normal limits. Major intracranial vascular flow voids are preserved.  Negative visible cervical spine.  Mildly decreased in heterogeneous bone marrow T1 signal, favor treatment related.  Visualized orbit soft tissues are within normal limits.  Visualized paranasal sinuses and mastoids are clear.  Visualized internal auditory structures appear normal.  Negative  scalp soft tissues.  IMPRESSION: Normal MRI appearance of the brain.  No metastatic disease identified.   Original Report Authenticated By: Erskine Speed, M.D.    Dg Kayleen Memos W/small Bowel  07/09/2012  *RADIOLOGY REPORT*  Clinical Data:Nausea.  Rectal cancer, undergoing radiation therapy. Suspected  gastroparesis.  Retained gastric contents on endoscopy. Recent C difficile infection.  UPPER GI W/ SMALL BOWEL  Prior to today's exam, I called Dr. Mitzi Hansen in radiation oncology by telephone to discuss whether the barium would interfere with the patients radiation therapy session planned for today.  He indicated that it should not be a problem.  Fluoroscopy Time: 7 minutes, 48 seconds  Comparison: 06/01/2012  Findings: The pharyngeal phase of swallowing appears normal. Primary peristaltic waves were preserved on all swallows.  No esophageal stricture, ulceration, or other significant esophageal abnormality is observed.  A slightly flattened appearance of the stomach antrum is attributed to the patient's slim body habitus.  Correlating with prior CT examinations, this portion of the stomach is interposed between the pancreas and abdominal wall without a great deal of space or expansion.  We do not demonstrate significant delay in contrast flow into the duodenum.  No gastric ulceration is observed.  Duodenum appears unremarkable.  No significant ulceration or definite fold thickening.  On the small bowel portion of the exam, jejunal folds appear normal.  Small bowel caliber unremarkable.  Terminal ileum unremarkable on the dedicated spot images.  The appendix was noted to fill with contrast medium.  IMPRESSION:  1.  No specific abnormality of the esophagus, stomach, or small bowel is identified.  There is some extrinsic narrowing of the stomach in the antrum region attributed to the patient's thin body habitus causing some extrinsic narrowing of the region; a specific abnormality in this vicinity was not observed on the recent endoscopy.   Original Report Authenticated By: Gaylyn Rong, M.D.     Medications: I have reviewed the patient's current medications.  Assessment/Plan:  1. Rectal cancer. Low rectal cancer with probable involvement of the anal margin/sphincter musculature status post surgical biopsy  05/13/2012 confirming adenocarcinoma with lymphovascular invasion, [id number]. Staging PET scan 06/01/2012 revealed increased FDG activity at the rectum, increased FDG activity of multiple abdominal/pelvic lymph nodes including a retroperitoneal node between the aorta and IVC and left inguinal nodes. She began radiation and Xeloda on 06/09/2012. Xeloda and radiation were placed on hold during the recent hospitalization. Radiation has been resumed. 2. Pain secondary to #1. Improved. She has discontinued OxyContin. 3. Intermittent nausea. Improved. 4. Firm mobile lymph node at the medial left inguinal region. Question malignant. 5. Hospitalization 07/02/2012 with possible dystonic reaction related to IV Phenergan. Resolved. 6. C. difficile colitis. She is completing a course of oral vancomycin. 7. EGD 07/08/2012 with finding of a large amount of retained gastric contents. Question gastroparesis. Nausea and abdominal pain are better. She continues Reglan.  Disposition-Ms. Pollak appears improved. She continues radiation. Dr. Truett Perna recommends resuming Xeloda. She will return for a followup visit in one week. She will contact the office in the interim with any problems.  Plan reviewed with Dr. Truett Perna.  [name] ANP/GNP-BC

## 2012-07-12 NOTE — Telephone Encounter (Signed)
gv and printed appt sched and avs for pt for May...pt advised that she did not need to go back to the lab they would ck potassium from blood already taken...and did not need to come back wednesday

## 2012-07-13 ENCOUNTER — Ambulatory Visit: Payer: BC Managed Care – PPO

## 2012-07-13 ENCOUNTER — Ambulatory Visit
Admission: RE | Admit: 2012-07-13 | Discharge: 2012-07-13 | Disposition: A | Discharge status: Home / Self Care | Payer: BC Managed Care – PPO | Source: Ambulatory Visit | Attending: Radiation Oncology | Admitting: Radiation Oncology

## 2012-07-14 ENCOUNTER — Ambulatory Visit
Admission: RE | Admit: 2012-07-14 | Discharge: 2012-07-14 | Disposition: A | Discharge status: Home / Self Care | Payer: BC Managed Care – PPO | Source: Ambulatory Visit | Attending: Radiation Oncology | Admitting: Radiation Oncology

## 2012-07-14 ENCOUNTER — Telehealth: Payer: Self-pay | Admitting: *Deleted

## 2012-07-14 ENCOUNTER — Ambulatory Visit: Payer: BC Managed Care – PPO

## 2012-07-14 ENCOUNTER — Encounter: Payer: Self-pay | Admitting: Radiation Oncology

## 2012-07-14 VITALS — BP 115/84 | HR 91 | Temp 97.9°F | Resp 18 | Wt 97.7 lb

## 2012-07-14 DIAGNOSIS — C20 Malignant neoplasm of rectum: Secondary | ICD-10-CM

## 2012-07-14 MED ORDER — LORAZEPAM 0.5 MG PO TABS: 0.5000 mg | ORAL_TABLET | Freq: Three times a day (TID) | ORAL | Status: DC | PRN

## 2012-07-14 NOTE — Progress Notes (Signed)
   Department of Radiation Oncology  Phone:  [phone] Fax:        [phone]  Weekly Treatment Note    Name: Leah Le Date: 07/14/2012 MRN: [id number] DOB: 01-10-1961   Current dose: 41.4 Gy  Current fraction: 23   MEDICATIONS: Current Outpatient Prescriptions  Medication Sig Dispense Refill  . lidocaine (XYLOCAINE JELLY) 2 % jelly Apply to affected area daily prn  30 mL  1  . lipase/protease/amylase (CREON-10/PANCREASE) 12000 UNITS CPEP Take 2 capsules by mouth 3 (three) times daily before meals.  270 capsule  3  . LORazepam (ATIVAN) 0.5 MG tablet Take 1 tablet (0.5 mg total) by mouth every 8 (eight) hours as needed for anxiety.  60 tablet  0  . metoCLOPramide (REGLAN) 10 MG tablet Take 1 tablet (10 mg total) by mouth 4 (four) times daily -  before meals and at bedtime.  120 tablet  3  . potassium chloride SA (K-DUR,KLOR-CON) 20 MEQ tablet Take 1 tablet (20 mEq total) by mouth daily.  30 tablet  3  . saccharomyces boulardii (FLORASTOR) 250 MG capsule Take 2 capsules (500 mg total) by mouth 2 (two) times daily.  60 capsule  4  . sucralfate (CARAFATE) 1 GM/10ML suspension Take 10 mLs (1 g total) by mouth 4 (four) times daily -  with meals and at bedtime.  420 mL  4  . temazepam (RESTORIL) 15 MG capsule Take 1 capsule (15 mg total) by mouth at bedtime as needed for sleep.  30 capsule  0  . vancomycin (VANCOCIN) 50 mg/mL oral solution Take 2.5 mLs (125 mg total) by mouth every 6 (six) hours.  70 mL  1   No current facility-administered medications for this encounter.     ALLERGIES: Phenergan; Tetanus toxoids; and Benadryl   LABORATORY DATA:  Lab Results  Component Value Date   WBC 4.8 07/12/2012   HGB 13.2 07/12/2012   HCT 38.8 07/12/2012   MCV 90.3 07/12/2012   PLT 247 07/12/2012   Lab Results  Component Value Date   NA 138 07/12/2012   K 3.5 07/12/2012   CL 103 07/12/2012   CO2 24 07/12/2012   Lab Results  Component Value Date   ALT 12 07/05/2012   AST 16  07/05/2012   ALKPHOS 39 07/05/2012   BILITOT 0.3 07/05/2012     NARRATIVE: Leah Le was seen today for weekly treatment management. The chart was checked and the patient's films were reviewed. The patient is doing much better. Just over 1 week remaining. The patient needs a refill for Ativan. She also was wondering if it was okay for her to take Diflucan which she was given previously for a yeast infection if needed. She took one dose previously. He feels some itching and burning in the vaginal area similar to previously.  PHYSICAL EXAMINATION: weight is 97 lb 11.2 oz (44.316 kg). Her oral temperature is 97.9 F (36.6 C). Her blood pressure is 115/84 and her pulse is 91. Her respiration is 18 and oxygen saturation is 100%.        ASSESSMENT: The patient is doing satisfactorily with treatment.  PLAN: We will continue with the patient's radiation treatment as planned. The patient will take 1 dose of Diflucan as discussed. We will see how this helps her symptoms. Some residual irritation will be expected to 2 radiotherapy course however.

## 2012-07-14 NOTE — Telephone Encounter (Signed)
Spoke with Dr. Joellen Jersey nurse, Val, who is going to see patient today for complaints of vaginal burning.  Patient is due at [facility] this afternoon for RT.

## 2012-07-14 NOTE — Telephone Encounter (Signed)
Received message from patient stating she has vaginal burning and is concerned she has a yeast infection.  She continues to take Vancomycin for C-diff.  She wants this checked out.  She also needs more Ativan.  She is coming to RT at 3:45 today.  Message given to Dr. Kalman Drape nurse.

## 2012-07-14 NOTE — Progress Notes (Signed)
Patient presents to the clinic today unaccompanied for PUT with Dr. Mitzi Hansen. Patient questioning if she can take diflucan pill for yeast infection. Also, patient requesting refill on ativan taken prior to daily xrt. Patient alert and oriented to person, place, and time. No distress noted. Steady gait noted. Pleasant affect noted. Patient denies pain at this time. Patient reports increased burning, itching and vaginal discharge since being hospitalized and taking flagyl and diflucan. Patient reports that she has one diflucan tablet at home and wants to confirm it is safe to take it. Patient reports that diarrhea associated with c diff continues. Reported all findings to Dr. Mitzi Hansen.

## 2012-07-14 NOTE — Telephone Encounter (Signed)
Per Dr. Truett Perna: Have radiation oncology nurse/MD assess this today while she is there for her treatment. Vicie Mutters, RN made aware and will follow up with radiation oncology staff.

## 2012-07-15 ENCOUNTER — Ambulatory Visit: Payer: BC Managed Care – PPO

## 2012-07-15 ENCOUNTER — Ambulatory Visit
Admission: RE | Admit: 2012-07-15 | Discharge: 2012-07-15 | Disposition: A | Discharge status: Home / Self Care | Payer: BC Managed Care – PPO | Source: Ambulatory Visit | Attending: Radiation Oncology | Admitting: Radiation Oncology

## 2012-07-16 ENCOUNTER — Ambulatory Visit
Admission: RE | Admit: 2012-07-16 | Discharge: 2012-07-16 | Disposition: A | Discharge status: Home / Self Care | Payer: BC Managed Care – PPO | Source: Ambulatory Visit | Attending: Radiation Oncology | Admitting: Radiation Oncology

## 2012-07-16 ENCOUNTER — Ambulatory Visit: Payer: BC Managed Care – PPO

## 2012-07-19 ENCOUNTER — Ambulatory Visit (HOSPITAL_BASED_OUTPATIENT_CLINIC_OR_DEPARTMENT_OTHER): Payer: BC Managed Care – PPO

## 2012-07-19 ENCOUNTER — Other Ambulatory Visit: Payer: BC Managed Care – PPO | Admitting: Lab

## 2012-07-19 ENCOUNTER — Telehealth: Payer: Self-pay | Admitting: Oncology

## 2012-07-19 ENCOUNTER — Other Ambulatory Visit: Payer: Self-pay | Admitting: Nurse Practitioner

## 2012-07-19 ENCOUNTER — Ambulatory Visit (HOSPITAL_BASED_OUTPATIENT_CLINIC_OR_DEPARTMENT_OTHER)
Admission: RE | Admit: 2012-07-19 | Discharge: 2012-07-19 | Disposition: A | Discharge status: Home / Self Care | Payer: BC Managed Care – PPO | Source: Ambulatory Visit | Attending: Radiation Oncology | Admitting: Radiation Oncology

## 2012-07-19 ENCOUNTER — Ambulatory Visit (HOSPITAL_BASED_OUTPATIENT_CLINIC_OR_DEPARTMENT_OTHER): Payer: BC Managed Care – PPO | Admitting: Nurse Practitioner

## 2012-07-19 ENCOUNTER — Telehealth: Payer: Self-pay | Admitting: *Deleted

## 2012-07-19 ENCOUNTER — Ambulatory Visit: Payer: BC Managed Care – PPO

## 2012-07-19 VITALS — BP 116/87 | HR 116 | Temp 97.7°F | Resp 18 | Ht 63.5 in | Wt 95.0 lb

## 2012-07-19 DIAGNOSIS — C20 Malignant neoplasm of rectum: Secondary | ICD-10-CM

## 2012-07-19 DIAGNOSIS — C19 Malignant neoplasm of rectosigmoid junction: Secondary | ICD-10-CM

## 2012-07-19 DIAGNOSIS — R197 Diarrhea, unspecified: Secondary | ICD-10-CM

## 2012-07-19 DIAGNOSIS — R634 Abnormal weight loss: Secondary | ICD-10-CM

## 2012-07-19 DIAGNOSIS — R630 Anorexia: Secondary | ICD-10-CM

## 2012-07-19 LAB — COMPREHENSIVE METABOLIC PANEL (CC13)
AST: 55 U/L — ABNORMAL HIGH (ref 5–34)
Albumin: 2.9 g/dL — ABNORMAL LOW (ref 3.5–5.0)
Alkaline Phosphatase: 120 U/L (ref 40–150)
BUN: 6.2 mg/dL — ABNORMAL LOW (ref 7.0–26.0)
Calcium: 8.2 mg/dL — ABNORMAL LOW (ref 8.4–10.4)
Chloride: 102 mEq/L (ref 98–107)
Glucose: 95 mg/dl (ref 70–99)
Potassium: 2.8 mEq/L — CL (ref 3.5–5.1)
Sodium: 136 mEq/L (ref 136–145)
Total Protein: 5.7 g/dL — ABNORMAL LOW (ref 6.4–8.3)

## 2012-07-19 LAB — CBC WITH DIFFERENTIAL/PLATELET
Basophils Absolute: 0 10*3/uL (ref 0.0–0.1)
Eosinophils Absolute: 0.3 10*3/uL (ref 0.0–0.5)
HGB: 11.8 g/dL (ref 11.6–15.9)
NEUT#: 3 10*3/uL (ref 1.5–6.5)
RBC: 3.67 10*6/uL — ABNORMAL LOW (ref 3.70–5.45)
RDW: 16.2 % — ABNORMAL HIGH (ref 11.2–14.5)
WBC: 4.2 10*3/uL (ref 3.9–10.3)
lymph#: 0.1 10*3/uL — ABNORMAL LOW (ref 0.9–3.3)

## 2012-07-19 MED ORDER — SODIUM CHLORIDE 0.9 % IV SOLN
INTRAVENOUS | Status: DC
  Administered 2012-07-19: 13:00:00 via INTRAVENOUS

## 2012-07-19 MED ORDER — LORAZEPAM 1 MG PO TABS: 0.5000 mg | ORAL_TABLET | Freq: Once | ORAL | Status: DC

## 2012-07-19 NOTE — Telephone Encounter (Signed)
Faxed MD office note, recent labs, Ht, Wt, recent wt loss, demographics, allergies, meds or orders for "home TPN, labs per protocol and [facility] to monitor" to [facility] pharmacy

## 2012-07-19 NOTE — Patient Instructions (Addendum)
Dehydration, Adult Dehydration is when you lose more fluids from the body than you take in. Vital organs like the kidneys, brain, and heart cannot function without a proper amount of fluids and salt. Any loss of fluids from the body can cause dehydration.  CAUSES   Vomiting.  Diarrhea.  Excessive sweating.  Excessive urine output.  Fever. SYMPTOMS  Mild dehydration  Thirst.  Dry lips.  Slightly dry mouth. Moderate dehydration  Very dry mouth.  Sunken eyes.  Skin does not bounce back quickly when lightly pinched and released.  Dark urine and decreased urine production.  Decreased tear production.  Headache. Severe dehydration  Very dry mouth.  Extreme thirst.  Rapid, weak pulse (more than 100 beats per minute at rest).  Cold hands and feet.  Not able to sweat in spite of heat and temperature.  Rapid breathing.  Blue lips.  Confusion and lethargy.  Difficulty being awakened.  Minimal urine production.  No tears. DIAGNOSIS  Your caregiver will diagnose dehydration based on your symptoms and your exam. Blood and urine tests will help confirm the diagnosis. The diagnostic evaluation should also identify the cause of dehydration. TREATMENT  Treatment of mild or moderate dehydration can often be done at home by increasing the amount of fluids that you drink. It is best to drink small amounts of fluid more often. Drinking too much at one time can make vomiting worse. Refer to the home care instructions below. Severe dehydration needs to be treated at the hospital where you will probably be given intravenous (IV) fluids that contain water and electrolytes. HOME CARE INSTRUCTIONS   Ask your caregiver about specific rehydration instructions.  Drink enough fluids to keep your urine clear or pale yellow.  Drink small amounts frequently if you have nausea and vomiting.  Eat as you normally do.  Avoid:  Foods or drinks high in sugar.  Carbonated  drinks.  Juice.  Extremely hot or cold fluids.  Drinks with caffeine.  Fatty, greasy foods.  Alcohol.  Tobacco.  Overeating.  Gelatin desserts.  Wash your hands well to avoid spreading bacteria and viruses.  Only take over-the-counter or prescription medicines for pain, discomfort, or fever as directed by your caregiver.  Ask your caregiver if you should continue all prescribed and over-the-counter medicines.  Keep all follow-up appointments with your caregiver. SEEK MEDICAL CARE IF:  You have abdominal pain and it increases or stays in one area (localizes).  You have a rash, stiff neck, or severe headache.  You are irritable, sleepy, or difficult to awaken.  You are weak, dizzy, or extremely thirsty. SEEK IMMEDIATE MEDICAL CARE IF:   You are unable to keep fluids down or you get worse despite treatment.  You have frequent episodes of vomiting or diarrhea.  You have blood or green matter (bile) in your vomit.  You have blood in your stool or your stool looks black and tarry.  You have not urinated in 6 to 8 hours, or you have only urinated a small amount of very dark urine.  You have a fever.  You faint. MAKE SURE YOU:   Understand these instructions.  Will watch your condition.  Will get help right away if you are not doing well or get worse. Document Released: 03/03/2005 Document Revised: 05/26/2011 Document Reviewed: 10/21/2010 [facility] Patient Information 2013 [place], [place].  Take 20 MeQ of potassium tonight 07/19/2012 and one tablet in the morning and one tomorrow evening per [name], NP

## 2012-07-19 NOTE — Telephone Encounter (Signed)
Sister called and reported that patient is much worse.  She is not eating and is barely taking sips of water.  She did take Xeloda this morning.  Sister reported patient is so weak that she has to get around in a wheelchair now because too weak to walk.  She states "she has mentally given up".  She stated they want the patient to be admitted for fluids and nutrition.  Patient has an appointment today at 2:15 with NP.  Per Dr. Truett Perna, have patient come on to the office now.  Family informed and will bring patient as soon as possible.

## 2012-07-19 NOTE — Progress Notes (Signed)
OFFICE PROGRESS NOTE  Interval history:  Leah Le returns as scheduled. Oral intake continues to be poor. She feels that she is "starving". Nausea continues to be improved though she continues to have mild intermittent nausea. She has extreme sensitivity to certain smells. She continues to have loose stools estimating 10 small volume per day. The diarrhea is better than when she was diagnosed with the C. difficile colitis. There has been no rectal bleeding. She notes marked early satiety. No lightheadedness or dizziness. Abdominal pain continues to be significantly improved.   Objective: Blood pressure 116/87, pulse 116, temperature 97.7 F (36.5 C), resp. rate 18, height 5' 3.5" (1.613 m), weight 95 lb (43.092 kg), last menstrual period 07/01/2012.  No thrush or ulceration. Mucous membranes appear moist. Lungs are clear. Regular cardiac rhythm. Tachycardic. Abdomen is soft and nontender. No hepatomegaly. Extremities are without edema. Shotty less than pea-sized node at the outer left inguinal region. Skin turgor is normal.  Lab Results: Lab Results  Component Value Date   WBC 4.8 07/12/2012   HGB 13.2 07/12/2012   HCT 38.8 07/12/2012   MCV 90.3 07/12/2012   PLT 247 07/12/2012    Chemistry:    Chemistry      Component Value Date/Time   NA 138 07/12/2012 1558   NA 135 07/11/2012 0438   K 3.5 07/12/2012 1558   K 3.9 07/11/2012 0438   CL 103 07/12/2012 1558   CL 105 07/11/2012 0438   CO2 24 07/12/2012 1558   CO2 23 07/11/2012 0438   BUN 4.6* 07/12/2012 1558   BUN <3* 07/11/2012 0438   CREATININE 0.7 07/12/2012 1558   CREATININE 0.52 07/11/2012 0438      Component Value Date/Time   CALCIUM 9.2 07/12/2012 1558   CALCIUM 8.5 07/11/2012 0438   ALKPHOS 39 07/05/2012 0409   AST 16 07/05/2012 0409   ALT 12 07/05/2012 0409   BILITOT 0.3 07/05/2012 0409       Studies/Results: [name] Contrast  07/04/2012  *RADIOLOGY REPORT*  Clinical Data: 52 year old female with recent diagnosis of rectal  cancer.  Unexplained refractory nausea and vomiting.  Staging.  MRI HEAD WITHOUT AND WITH CONTRAST  Technique:  Multiplanar, multiecho pulse sequences of the brain and surrounding structures were obtained according to standard protocol without and with intravenous contrast  Contrast: 11mL MULTIHANCE GADOBENATE DIMEGLUMINE 529 MG/ML IV SOLN  Comparison: PET CT 06/01/2012.  Findings: No abnormal enhancement identified.  No cerebral, brainstem or cerebellar edema. Wallace Cullens and white matter signal is within normal limits throughout the brain.  No restricted diffusion to suggest acute infarction.  No midline shift, ventriculomegaly, mass effect, evidence of mass lesion, extra-axial collection or acute intracranial hemorrhage. Cervicomedullary junction and pituitary are within normal limits. Major intracranial vascular flow voids are preserved.  Negative visible cervical spine.  Mildly decreased in heterogeneous bone marrow T1 signal, favor treatment related.  Visualized orbit soft tissues are within normal limits.  Visualized paranasal sinuses and mastoids are clear.  Visualized internal auditory structures appear normal.  Negative scalp soft tissues.  IMPRESSION: Normal MRI appearance of the brain.  No metastatic disease identified.   Original Report Authenticated By: Erskine Speed, M.D.    Dg Kayleen Memos W/small Bowel  07/09/2012  *RADIOLOGY REPORT*  Clinical Data:Nausea.  Rectal cancer, undergoing radiation therapy. Suspected gastroparesis.  Retained gastric contents on endoscopy. Recent C difficile infection.  UPPER GI W/ SMALL BOWEL  Prior to today's exam, I called Dr. Mitzi Hansen in radiation oncology by telephone  to discuss whether the barium would interfere with the patients radiation therapy session planned for today.  He indicated that it should not be a problem.  Fluoroscopy Time: 7 minutes, 48 seconds  Comparison: 06/01/2012  Findings: The pharyngeal phase of swallowing appears normal. Primary peristaltic waves were preserved  on all swallows.  No esophageal stricture, ulceration, or other significant esophageal abnormality is observed.  A slightly flattened appearance of the stomach antrum is attributed to the patient's slim body habitus.  Correlating with prior CT examinations, this portion of the stomach is interposed between the pancreas and abdominal wall without a great deal of space or expansion.  We do not demonstrate significant delay in contrast flow into the duodenum.  No gastric ulceration is observed.  Duodenum appears unremarkable.  No significant ulceration or definite fold thickening.  On the small bowel portion of the exam, jejunal folds appear normal.  Small bowel caliber unremarkable.  Terminal ileum unremarkable on the dedicated spot images.  The appendix was noted to fill with contrast medium.  IMPRESSION:  1.  No specific abnormality of the esophagus, stomach, or small bowel is identified.  There is some extrinsic narrowing of the stomach in the antrum region attributed to the patient's thin body habitus causing some extrinsic narrowing of the region; a specific abnormality in this vicinity was not observed on the recent endoscopy.   Original Report Authenticated By: Gaylyn Rong, M.D.     Medications: I have reviewed the patient's current medications.  Assessment/Plan:  1. Rectal cancer. Low rectal cancer with probable involvement of the anal margin/sphincter musculature status post surgical biopsy 05/13/2012 confirming adenocarcinoma with lymphovascular invasion, uT3uN0. Staging PET scan 06/01/2012 revealed increased FDG activity at the rectum, increased FDG activity of multiple abdominal/pelvic lymph nodes including a retroperitoneal node between the aorta and IVC and left inguinal nodes. She began radiation and Xeloda on 06/09/2012. Xeloda and radiation were placed on hold during the recent hospitalization. Radiation has been resumed. Xeloda was resumed 07/12/2012. 2. Pain secondary to #1. Improved.  She has discontinued OxyContin. 3. Intermittent nausea. Improved. 4. Firm mobile lymph node at the medial left inguinal region. Question malignant. Not palpable on exam today. 5. Hospitalization 07/02/2012 with possible dystonic reaction related to IV Phenergan. Resolved. 6. C. difficile colitis. She is completing a course of oral vancomycin. She continues to have loose stools. The frequency and volume are considerably less. 7. EGD 07/08/2012 with finding of a large amount of retained gastric contents. Question gastroparesis. Nausea and abdominal pain are better. She continues Reglan. 8. Anorexia/weight loss/early satiety.  Disposition-she continues radiation and Xeloda. The etiology of the anorexia and early satiety is unclear. Due to concern for malnourishment she is being referred for urgent PICC line placement and initiation of TPN. She will receive a liter of IV fluids in the office today as well as labs to include a CBC, chemistry panel and magnesium.  She will return for a followup visit with Dr. Truett Perna on 07/26/2012.  Patient seen with Dr. Truett Perna.  [name] ANP/GNP-BC

## 2012-07-20 ENCOUNTER — Ambulatory Visit: Payer: BC Managed Care – PPO

## 2012-07-20 ENCOUNTER — Ambulatory Visit (HOSPITAL_COMMUNITY)
Admission: RE | Admit: 2012-07-20 | Discharge: 2012-07-20 | Disposition: A | Discharge status: Home / Self Care | Payer: BC Managed Care – PPO | Source: Ambulatory Visit | Attending: Nurse Practitioner | Admitting: Nurse Practitioner

## 2012-07-20 ENCOUNTER — Telehealth: Payer: Self-pay | Admitting: *Deleted

## 2012-07-20 ENCOUNTER — Ambulatory Visit
Admission: RE | Admit: 2012-07-20 | Discharge: 2012-07-20 | Disposition: A | Discharge status: Home / Self Care | Payer: BC Managed Care – PPO | Source: Ambulatory Visit | Attending: Radiation Oncology | Admitting: Radiation Oncology

## 2012-07-20 ENCOUNTER — Other Ambulatory Visit: Payer: Self-pay | Admitting: Nurse Practitioner

## 2012-07-20 DIAGNOSIS — C20 Malignant neoplasm of rectum: Secondary | ICD-10-CM

## 2012-07-20 DIAGNOSIS — E46 Unspecified protein-calorie malnutrition: Secondary | ICD-10-CM | POA: Insufficient documentation

## 2012-07-20 MED ORDER — LIDOCAINE HCL 1 % IJ SOLN
INTRAMUSCULAR | Status: AC
  Filled 2012-07-20: qty 20

## 2012-07-20 NOTE — Procedures (Signed)
Successful image guided placement of right UE DL PICC to brachial vein Length 32cm, tip in lower SVC/RA No complications Ready for use.

## 2012-07-20 NOTE — Telephone Encounter (Signed)
Spoke with SW and dietician re: patient's continued nausea and inability to eat.  Asked that patient be assessed for their opinions to be sure needs are being met and all barriers to care are being addressed.

## 2012-07-21 ENCOUNTER — Ambulatory Visit
Admission: RE | Admit: 2012-07-21 | Discharge: 2012-07-21 | Disposition: A | Discharge status: Home / Self Care | Payer: BC Managed Care – PPO | Source: Ambulatory Visit | Attending: Radiation Oncology | Admitting: Radiation Oncology

## 2012-07-21 NOTE — Discharge Summary (Signed)
Physician Discharge Summary  Patient ID: Leah Le  MRN: [id number] DOB/AGE: Jul 16, 1960 52 y.o.  Admit date: 07/02/2012 Discharge date: 07/11/2012    Discharge Diagnoses:  1. Rectal cancer completing neoadjuvant Xeloda and radiation. 2. Intractable nausea. Improved. 3. Abdominal pain. Improved. 4. Involuntary movements noted while in radiation oncology 07/02/2012. Resolved. Question dystonic reaction related to IV Phenergan. 5. C. difficile colitis completing oral vancomycin. 6. Hypokalemia related to diarrhea. 7. Status post upper endoscopy 07/08/2012. Large amount of retained gastric contents noted. Mild proximal gastritis. Symptoms and endoscopic findings felt to be most supportive of gastroparesis. Upper GI on 07/09/2012 showed no specific abnormality of the esophagus, stomach or small bowel. There was no significant delay of contrast flowing into the duodenum. Abdominal pain and nausea improved with a trial of Reglan.  Discharged Condition: Stable.  Discharge Labs:  Hemoglobin 11.6, white count 3.5, platelet count 204, sodium 135, potassium 3.9, BUN less than 3, creatinine 0.52.  Significant Diagnostic Studies:. 1. Upper GI 07/09/2012 with no specific abnormality of the esophagus, stomach or small bowel. No significant delayed contrast flowing into the duodenum. 2. MRI of the brain 07/04/2012. Normal MRI appearance of the brain. No metastatic disease identified.  Consults:  1. Gastroenterology.  Procedures:  1. Upper endoscopy 07/08/2012 with large amount of retained gastric contents noted; mild proximal gastritis.  Disposition: 01-Home or Self Care     Medication List    STOP taking these medications       capecitabine 500 MG tablet  Commonly known as:  XELODA     dronabinol 5 MG capsule  Commonly known as:  MARINOL     oxyCODONE 10 MG 12 hr tablet  Commonly known as:  OXYCONTIN     polyethylene glycol packet  Commonly known as:  MIRALAX / GLYCOLAX      promethazine 12.5 MG tablet  Commonly known as:  PHENERGAN     temazepam 15 MG capsule  Commonly known as:  RESTORIL      TAKE these medications       lidocaine 2 % jelly  Commonly known as:  XYLOCAINE JELLY  Apply to affected area daily prn     lipase/protease/amylase 56213 UNITS Cpep  Commonly known as:  CREON-10/PANCREASE  Take 2 capsules by mouth 3 (three) times daily before meals.     metoCLOPramide 10 MG tablet  Commonly known as:  REGLAN  Take 1 tablet (10 mg total) by mouth 4 (four) times daily -  before meals and at bedtime.     potassium chloride SA 20 MEQ tablet  Commonly known as:  K-DUR,KLOR-CON  Take 1 tablet (20 mEq total) by mouth daily.     saccharomyces boulardii 250 MG capsule  Commonly known as:  FLORASTOR  Take 2 capsules (500 mg total) by mouth 2 (two) times daily.     sucralfate 1 GM/10ML suspension  Commonly known as:  CARAFATE  Take 10 mLs (1 g total) by mouth 4 (four) times daily -  with meals and at bedtime.     vancomycin 50 mg/mL oral solution  Commonly known as:  VANCOCIN  Take 2.5 mLs (125 mg total) by mouth every 6 (six) hours.            Follow-up Information   Follow up with Thornton Papas, MD. (f/u 4/21 as scheduled for radiation)    Contact information:   [address] [place] [address] [phone]       Hospital Course: Ms. Baldini was admitted  to [facility] on 07/02/2012 with intractable nausea as well as involuntary movements noted while in radiation oncology on the day of admission. Admission labs showed sodium 141, potassium 3.0, BUN 8, creatinine 0.65, calcium 8.2, bilirubin 0.4, SGOT 24, SGPT 16, alkaline phosphatase 45, hemoglobin 11.4, white count 4.5, platelet count 153,000.  The involuntary movements noted on 07/02/2012 resolved and were felt to possibly be a dystonic reaction related to IV Phenergan.  The etiology of the nausea was unclear. MRI of the brain on 07/04/2012 showed a normal  appearance of the brain. No metastatic disease was identified. Gastroenterology consult was obtained to evaluate the nausea as well as upper abdominal pain. She underwent an upper endoscopy on 07/08/2012 with a large amount of retained gastric contents noted. Mild proximal gastritis also noted. The symptoms and endoscopic findings were felt to be most supportive of gastroparesis. Upper GI on 07/09/2012 showed no specific abnormality of the esophagus, stomach or small bowel. No significant delay of contrast flowing into the duodenum was noted. She was started on a trial of Reglan with improvement in the nausea and abdominal pain. Of note, she had no signs of a dystonic reaction to the Reglan.  She developed diarrhea during the hospitalization. Stool culture returned positive for C. difficile. She was started on Flagyl. The diarrhea improved. She was discharged home on oral vancomycin.  On 07/11/2012 the nausea, abdominal pain and diarrhea were all improved. She was felt to be stable for discharge home.   Discharge Orders   Future Appointments Provider Department Dept Phone   07/22/2012 9:00 AM [facility] RAD[facility] [phone]   07/23/2012 9:00 AM [facility] [place] [phone]   07/26/2012 3:00 PM [facility] MEDICAL ONCOLOGY [phone]   07/26/2012 3:30 PM Ladene Artist, MD [facility] MEDICAL ONCOLOGY [phone]   Future Orders Complete By Expires     Discharge patient  As directed     Discontinue IV  As directed        Signed: [name] 07/21/2012, 12:52 PM

## 2012-07-22 ENCOUNTER — Encounter: Payer: Self-pay | Admitting: Nutrition

## 2012-07-22 ENCOUNTER — Ambulatory Visit
Admission: RE | Admit: 2012-07-22 | Discharge: 2012-07-22 | Disposition: A | Discharge status: Home / Self Care | Payer: BC Managed Care – PPO | Source: Ambulatory Visit | Attending: Radiation Oncology | Admitting: Radiation Oncology

## 2012-07-22 ENCOUNTER — Ambulatory Visit: Payer: BC Managed Care – PPO

## 2012-07-22 NOTE — Progress Notes (Signed)
I spoke with [facility] pharmacist regarding patient's TNA. Pharmacist reports patient's labs are improved. They are monitoring glucose, magnesium, phosphorus and potassium refeeding syndrome. At this time pharmacist reports labs are within normal limits.  I also called patient on telephone and spoke with caregiver. Her caregiver reports the patient is overall improved. Her diarrhea has decreased slightly. Her nausea is slightly better. Patient is eating a little bit more than she was. Patient is still weak. I educated patient's caregiver to provide bland foods patient requests in very small amounts. I've encouraged patient and caregiver to contact me if they have any questions regarding resuming oral nutrition. They've agreed to do so. I will attempt to follow patient throughout treatment.

## 2012-07-23 ENCOUNTER — Ambulatory Visit
Admission: RE | Admit: 2012-07-23 | Discharge: 2012-07-23 | Disposition: A | Discharge status: Home / Self Care | Payer: BC Managed Care – PPO | Source: Ambulatory Visit | Attending: Radiation Oncology | Admitting: Radiation Oncology

## 2012-07-23 ENCOUNTER — Encounter: Payer: Self-pay | Admitting: Radiation Oncology

## 2012-07-23 VITALS — BP 99/73 | HR 104 | Temp 97.8°F | Wt 94.6 lb

## 2012-07-23 DIAGNOSIS — C20 Malignant neoplasm of rectum: Secondary | ICD-10-CM

## 2012-07-23 MED ORDER — BIAFINE EX EMUL
CUTANEOUS | Status: DC | PRN
  Administered 2012-07-23: 1 via TOPICAL

## 2012-07-23 MED ORDER — RADIAPLEXRX EX GEL
Freq: Once | CUTANEOUS | Status: AC
  Administered 2012-07-23: 1 via TOPICAL

## 2012-07-23 NOTE — Progress Notes (Signed)
Patient completes radiation today.Denies pain.Intermittent nausea relieved with reglan and ativan.Will give addition al tube of radiaplex to apply to pelvis.Patient starte TPN on Tuesday  07/20/12.Weight down to 94.6 lbs.

## 2012-07-23 NOTE — Progress Notes (Signed)
   Department of Radiation Oncology  Phone:  [phone] Fax:        [phone]  Weekly Treatment Note    Name: Leah Le Date: 07/23/2012 MRN: [id number] DOB: 1960-09-04   Current dose: 54 Gy  Current fraction: 30   MEDICATIONS: Current Outpatient Prescriptions  Medication Sig Dispense Refill  . lidocaine (XYLOCAINE JELLY) 2 % jelly Apply to affected area daily prn  30 mL  1  . LORazepam (ATIVAN) 0.5 MG tablet Take 1 tablet (0.5 mg total) by mouth every 8 (eight) hours as needed for anxiety.  60 tablet  0  . metoCLOPramide (REGLAN) 10 MG tablet Take 1 tablet (10 mg total) by mouth 4 (four) times daily -  before meals and at bedtime.  120 tablet  3  . saccharomyces boulardii (FLORASTOR) 250 MG capsule Take 2 capsules (500 mg total) by mouth 2 (two) times daily.  60 capsule  4  . temazepam (RESTORIL) 15 MG capsule Take 1 capsule (15 mg total) by mouth at bedtime as needed for sleep.  30 capsule  0  . lipase/protease/amylase (CREON-10/PANCREASE) 12000 UNITS CPEP Take 2 capsules by mouth 3 (three) times daily before meals.  270 capsule  3  . potassium chloride SA (K-DUR,KLOR-CON) 20 MEQ tablet Take 1 tablet (20 mEq total) by mouth daily.  30 tablet  3  . vancomycin (VANCOCIN) 50 mg/mL oral solution Take 2.5 mLs (125 mg total) by mouth every 6 (six) hours.  70 mL  1   Current Facility-Administered Medications  Medication Dose Route Frequency Provider Last Rate Last Dose  . topical emolient (BIAFINE) emulsion   Topical PRN Jonna Coup, MD   1 application at 07/23/12 1047     ALLERGIES: Phenergan; Tetanus toxoids; and Benadryl   LABORATORY DATA:  Lab Results  Component Value Date   WBC 4.2 07/19/2012   HGB 11.8 07/19/2012   HCT 33.1* 07/19/2012   MCV 90.2 07/19/2012   PLT 185 07/19/2012   Lab Results  Component Value Date   NA 136 07/19/2012   K 2.8 Repeated and Verified* 07/19/2012   CL 102 07/19/2012   CO2 24 07/19/2012   Lab Results  Component Value Date   ALT 85*  07/19/2012   AST 55* 07/19/2012   ALKPHOS 120 07/19/2012   BILITOT 0.94 07/19/2012     NARRATIVE: Leah Le was seen today for weekly treatment management. The chart was checked and the patient's films were reviewed. The patient finished her final treatment today. She feels very weak overall. Ongoing nausea with no major change. She has begun TPN. The patient's original rectal pain has been relieved.  PHYSICAL EXAMINATION: weight is 94 lb 9.6 oz (42.91 kg). Her temperature is 97.8 F (36.6 C). Her blood pressure is 99/73 and her pulse is 104. Her oxygen saturation is 97%.      the patient's skin is irritated without moist desquamation in the anal/inguinal regions. Significant hyperpigmentation present.  ASSESSMENT: The patient completed treatment with some difficulty including a hospital stay. She lost some significant weight and hopefully this will trend upwards now that she is finished treatment and additional nutritional support has been initiated.  PLAN: Followup in one month. The patient knows that we can see her sooner if necessary.

## 2012-07-26 ENCOUNTER — Telehealth: Payer: Self-pay | Admitting: Oncology

## 2012-07-26 ENCOUNTER — Ambulatory Visit (HOSPITAL_BASED_OUTPATIENT_CLINIC_OR_DEPARTMENT_OTHER): Payer: BC Managed Care – PPO | Admitting: Oncology

## 2012-07-26 ENCOUNTER — Other Ambulatory Visit: Payer: BC Managed Care – PPO | Admitting: Lab

## 2012-07-26 VITALS — BP 97/71 | HR 95 | Temp 98.2°F | Resp 17 | Ht 63.5 in | Wt 96.4 lb

## 2012-07-26 DIAGNOSIS — E876 Hypokalemia: Secondary | ICD-10-CM

## 2012-07-26 DIAGNOSIS — C20 Malignant neoplasm of rectum: Secondary | ICD-10-CM

## 2012-07-26 DIAGNOSIS — R634 Abnormal weight loss: Secondary | ICD-10-CM

## 2012-07-26 NOTE — Progress Notes (Signed)
Called [facility] pharmacy team with order to begin process of TPN taper to get her to 12 hour infusion.

## 2012-07-26 NOTE — Progress Notes (Signed)
    Cancer Center    OFFICE PROGRESS NOTE   INTERVAL HISTORY:   She returns as scheduled. She completed concurrent Xeloda and radiation on 07/23/2012. Leah Le has noted significant improvement in the abdominal pain and nausea. She is now eating small amounts. She continues to have frequent small volume loose stools.  She was started on TNA last week the a right arm PICC. The TNA is being cycled over 24 hours and manage by [facility].  The presenting rectal pain has resolved. She now has discomfort related to skin breakdown at the perineum.  Objective:  Vital signs in last 24 hours:  Blood pressure 97/71, pulse 95, temperature 98.2 F (36.8 C), temperature source Oral, resp. rate 17, height 5' 3.5" (1.613 m), weight 96 lb 6.4 oz (43.727 kg), last menstrual period 07/01/2012.    HEENT: No thrush or ulcer Resp: Lungs clear bilaterally Cardio: Regular rate and rhythm GI: No hepatomegaly, mild tenderness in the mid upper abdomen, no mass Vascular: No leg edema  Skin: Erythema at the perineum and groin. Small areas of superficial breakdown at the upper perineum/gluteal fold.   Portacath/PICC-without erythema  Lab Results:  Lab Results  Component Value Date   WBC 4.2 07/19/2012   HGB 11.8 07/19/2012   HCT 33.1* 07/19/2012   MCV 90.2 07/19/2012   PLT 185 07/19/2012   ANC 3.0    Medications: I have reviewed the patient's current medications.  Assessment/Plan: 1. Rectal cancer. Low rectal cancer with probable involvement of the anal margin/sphincter musculature status post surgical biopsy 05/13/2012 confirming adenocarcinoma with lymphovascular invasion, uT3uN0. Staging PET scan 06/01/2012 revealed increased FDG activity at the rectum, increased FDG activity of multiple abdominal/pelvic lymph nodes including a retroperitoneal node between the aorta and IVC and left inguinal nodes. She began radiation and Xeloda on 06/09/2012. Xeloda and radiation were placed on  hold during the recent hospitalization. Radiation has been resumed. Xeloda was resumed 07/12/2012. Concurrent Xeloda and radiation completed 07/23/2012. 2. Pain secondary to #1. Improved. She has discontinued OxyContin. 3. Intermittent nausea. Improved. 4. Firm mobile lymph node at the medial left inguinal region. Question malignant. No longer palpable 5. Hospitalization 07/02/2012 with possible dystonic reaction related to IV Phenergan. Resolved. 6. C. difficile colitis. She is completed a course of oral vancomycin. The diarrhea has improved . 7. EGD 07/08/2012 with finding of a large amount of retained gastric contents. Question gastroparesis. Nausea and abdominal pain are better. She continues Reglan. 8. Anorexia/weight loss/early satiety. Now maintained on home TNA 9. Hypokalemia secondary to diarrhea and malnutrition-now receiving potassium replacement via TNA  She has completed neoadjuvant therapy. She has developed radiation erythema/superficial skin breakdown at the perineum. The nausea, abdominal pain, and diarrhea have improved. Her appetite has improved. The plan is to continue TNA for at least one additional week. The potassium is being replaced by the [facility] pharmacy. We will ask the pharmacy to cycle the TNA over 12 hours.   Disposition:  Leah Le will return for an office visit in one week. She will contact us in the interim for new symptoms. We will refer her to Dr. Byrd Hesselbach within the next few weeks.   Thornton Papas, MD  07/26/2012  6:22 PM

## 2012-08-03 ENCOUNTER — Ambulatory Visit (HOSPITAL_BASED_OUTPATIENT_CLINIC_OR_DEPARTMENT_OTHER): Payer: BC Managed Care – PPO | Admitting: Oncology

## 2012-08-03 ENCOUNTER — Telehealth: Payer: Self-pay | Admitting: Oncology

## 2012-08-03 VITALS — BP 100/71 | HR 71 | Temp 97.1°F | Resp 18 | Ht 63.5 in | Wt 100.9 lb

## 2012-08-03 DIAGNOSIS — R110 Nausea: Secondary | ICD-10-CM

## 2012-08-03 DIAGNOSIS — C774 Secondary and unspecified malignant neoplasm of inguinal and lower limb lymph nodes: Secondary | ICD-10-CM

## 2012-08-03 DIAGNOSIS — G893 Neoplasm related pain (acute) (chronic): Secondary | ICD-10-CM

## 2012-08-03 DIAGNOSIS — C20 Malignant neoplasm of rectum: Secondary | ICD-10-CM

## 2012-08-03 NOTE — Progress Notes (Signed)
Called [facility] pharmacy with order to wean TNA to have her off this by next week.

## 2012-08-03 NOTE — Progress Notes (Signed)
   [facility]    OFFICE PROGRESS NOTE   INTERVAL HISTORY:   She returns as scheduled. She reports feeling much better. The TNA is now being cycled over 12 hours. She is starting to eat. She reports eating pizza and [place] food. The diarrhea has slowed significantly over the past few days. The nausea and pain remains improved. She complains of discomfort related to a "hemorrhoid ". The skin breakdown at the perineum has improved. Ms. Beaulieu has decreased the Reglan to 3 times per day and will continue weaning the Reglan per directions from Dr. Dulce Sellar.  Objective:  Vital signs in last 24 hours:  Blood pressure 100/71, pulse 71, temperature 97.1 F (36.2 C), temperature source Oral, resp. rate 18, height 5' 3.5" (1.613 m), weight 100 lb 14.4 oz (45.768 kg).    HEENT: No thrush or ulcer Lymphatics: Pea-sized left lateral inguinal node the Resp: Lungs clear bilaterally Cardio: Regular rate and rhythm GI: No hepatomegaly, no mass, nontender. Rectal-small soft hemorrhoid at the anterior anal verge Vascular: No leg edema  Skin: Radiation hyperpigmentation of the abdomen and back appears to be resolving. Skin breakdown at the groin and perineum is improved.   PICC-with mild erythema at the skin next  Lab Results: 08/02/2012-potassium 4.7, BUN 17, creatinine 0.46, alkaline phosphatase face 121, AST 44, ALT 50, albumin 3.3   Medications: I have reviewed the patient's current medications.  Assessment/Plan: 1. Rectal cancer. Low rectal cancer with probable involvement of the anal margin/sphincter musculature status post surgical biopsy 05/13/2012 confirming adenocarcinoma with lymphovascular invasion, uT3uN0. Staging PET scan 06/01/2012 revealed increased FDG activity at the rectum, increased FDG activity of multiple abdominal/pelvic lymph nodes including a retroperitoneal node between the aorta and IVC and left inguinal nodes. She began radiation and Xeloda on 06/09/2012.  Xeloda and radiation were placed on hold during the 07/02/2012 hospitalization. Radiation was resumed. Xeloda was resumed 07/12/2012. Concurrent Xeloda and radiation completed 07/23/2012. 2. Pain secondary to #1. Improved. She has discontinued OxyContin. 3. Intermittent nausea. Improved. 4. Firm mobile lymph node at the medial left inguinal region. Question malignant. No longer palpable 5. Hospitalization 07/02/2012 with possible dystonic reaction related to IV Phenergan. Resolved. 6. C. difficile colitis. She  completed a course of oral vancomycin. The diarrhea has improved . 7. EGD 07/08/2012 with finding of a large amount of retained gastric contents. Question gastroparesis. Nausea and abdominal pain are better. She continues Reglan-now being tapered. 8. Anorexia/weight loss/early satiety. Now maintained on home TNA, she has gained weight and she is starting to a 9. Hypokalemia secondary to diarrhea and malnutrition-now receiving potassium replacement via TNA  Disposition:  Her overall performance status appears much improved today. We will wean the TNA to off over the next week. She is scheduled to see Dr. Byrd Hesselbach later this week. Ms. Hendler will return for an office visit here in approximately 3 weeks. She knows to contact us for increased erythema or discomfort at the PICC site.   Thornton Papas, MD  08/03/2012  1:48 PM

## 2012-08-03 NOTE — Telephone Encounter (Signed)
gv and printed appt sched and avs for pt for JUne

## 2012-08-04 ENCOUNTER — Encounter: Payer: Self-pay | Admitting: Oncology

## 2012-08-04 NOTE — Progress Notes (Signed)
Put fmla form on nurse's desk °

## 2012-08-05 ENCOUNTER — Telehealth: Payer: Self-pay | Admitting: Oncology

## 2012-08-05 NOTE — Telephone Encounter (Signed)
Faxed pt medical records to Dr. Byrd Hesselbach

## 2012-08-05 NOTE — Telephone Encounter (Signed)
Spoke with patient by phone.   She saw Dr. Byrd Hesselbach today.  She stated that her tentative surgery date is July 2nd.  She will keep Dr. Truett Perna informed if date changes.

## 2012-08-08 NOTE — Progress Notes (Addendum)
  Radiation Oncology         [phone] ________________________________  Name: Leah Le MRN: [id number]  Date: 07/23/2012  DOB: 1960/07/30  End of Treatment Note  Diagnosis:   Rectal cancer     Indication for treatment:  Curative       Radiation treatment dates:   06/09/2012 through 07/23/2012  Site/dose:   The patient was treated to the gross tumor volume as well as involved and high-risk lymph node regions. The patient's treatment was delivered in a manner similar to anal cancer patients given the extent of her disease. This was given and therefore using a IMRT technique with daily image guidance on our tomotherapy unit. The patient required replanning during her radiation treatment due to weight loss. The patient's total dose was 54 gray at 1.8 gray per fraction.  Narrative: The patient was able to complete the prescribed course of treatment. She did have substantial difficulty with nausea. This was present to some degree prior to treatment and this continued to be a major issue for her during the entirety of her course of radiation. The patient did require hospitalization. The patient was also given parenteral nutrition towards the end of treatment.  Plan: The patient has completed radiation treatment. The patient will return to radiation oncology clinic for routine followup in one month. I advised the patient to call or return sooner if they have any questions or concerns related to their recovery or treatment. ________________________________  Radene Gunning, M.D., Ph.D.

## 2012-08-08 NOTE — Addendum Note (Signed)
Encounter addended by: Jonna Coup, MD on: 08/08/2012  7:33 PM<BR>     Documentation filed: Visit Diagnoses, Notes Section

## 2012-08-11 ENCOUNTER — Other Ambulatory Visit: Payer: Self-pay | Admitting: Medical Oncology

## 2012-08-11 ENCOUNTER — Encounter: Payer: Self-pay | Admitting: Oncology

## 2012-08-11 ENCOUNTER — Telehealth: Payer: Self-pay | Admitting: *Deleted

## 2012-08-11 NOTE — Progress Notes (Signed)
Faxed fmla form to [facility] @ [phone] °

## 2012-08-11 NOTE — Telephone Encounter (Signed)
Received phone call from patient stating she is supposed to complete the TPN weaning on 08/12/12.  She stated that she would like to keep the PICC line in at least a week longer just in case she needs fluids, etc.  She is asking that Dr. Truett Perna  approve her request and allow HH to continue checking the site and drawing blood work from the PICC.  She says she felt hungry this morning, but that her eating is still very little and she is drinking much better around 50 oz water/day  and energy is getting better.  This RN offered to set up another appointment with dietician or have the dietician contact patient but the patient declined.  " I know what I need to do and do not want to see dietician at this time".  She is requesting lab results from [facility] lab draw on 08/09/12.  This nurse gave Dr. Kalman Drape nurse this information to discuss with Dr. Truett Perna.

## 2012-08-12 ENCOUNTER — Telehealth: Payer: Self-pay | Admitting: Oncology

## 2012-08-12 ENCOUNTER — Encounter: Payer: Self-pay | Admitting: Oncology

## 2012-08-12 NOTE — Telephone Encounter (Signed)
Per 5/28 pof added picc flush appts for 6/2, 6/4. 6/6. S/w pt and per pt between her [facility] Nurse and her caregiver her picc is being flushed daily. Pt informed appts would  Be cx'd and message sent to desk nurse. Pt aware I will call her if we need to do anything different. lmonvm for desk nurse re above and asked that she call me if we need to change this but for now picc appts have been cx'd.

## 2012-08-17 ENCOUNTER — Other Ambulatory Visit: Payer: Self-pay | Admitting: Nurse Practitioner

## 2012-08-17 ENCOUNTER — Other Ambulatory Visit: Payer: Self-pay | Admitting: *Deleted

## 2012-08-17 DIAGNOSIS — C20 Malignant neoplasm of rectum: Secondary | ICD-10-CM

## 2012-08-17 NOTE — Telephone Encounter (Signed)
Last TPN infusion was 08/13/12. Patient asking to keep her PICC line until seen by Dr. Truett Perna on 08/25/12 to be sure she can tolerate her diet. If OK with MD, nurse will change dressing on 6/9 and will not draw labs unless MD requests labs. Site is unremarkable at last dressing change yesterday. Patient is independent in flushing PICC.

## 2012-08-18 ENCOUNTER — Telehealth: Payer: Self-pay | Admitting: Oncology

## 2012-08-18 NOTE — Telephone Encounter (Signed)
Added flush appt to 6/11 f/u per 6/3 pof PICC to be pulled. Start time has not changed.

## 2012-08-20 ENCOUNTER — Encounter: Payer: Self-pay | Admitting: Oncology

## 2012-08-24 ENCOUNTER — Telehealth: Payer: Self-pay | Admitting: *Deleted

## 2012-08-24 NOTE — Telephone Encounter (Signed)
Message from [place], home care nurse reporting pt is having more difficulty having bowel movements. Straining to move bowels, pain 4/10. Notices stool is soft and formed when she passes it. Dr. Truett Perna made aware, he recommends stool softener. Called pt, she reports it feels more like a tenderness at her rectum. Recommended she begin stool softener. Pt stated she will resume Miralax. Scheduled for office visit 6/11.

## 2012-08-25 ENCOUNTER — Ambulatory Visit (HOSPITAL_BASED_OUTPATIENT_CLINIC_OR_DEPARTMENT_OTHER): Payer: BC Managed Care – PPO | Admitting: Lab

## 2012-08-25 ENCOUNTER — Telehealth: Payer: Self-pay | Admitting: Oncology

## 2012-08-25 ENCOUNTER — Ambulatory Visit (HOSPITAL_BASED_OUTPATIENT_CLINIC_OR_DEPARTMENT_OTHER): Payer: BC Managed Care – PPO | Admitting: Oncology

## 2012-08-25 VITALS — BP 109/71 | HR 89 | Temp 97.8°F | Resp 18 | Ht 63.5 in | Wt 102.1 lb

## 2012-08-25 DIAGNOSIS — C778 Secondary and unspecified malignant neoplasm of lymph nodes of multiple regions: Secondary | ICD-10-CM

## 2012-08-25 DIAGNOSIS — C20 Malignant neoplasm of rectum: Secondary | ICD-10-CM

## 2012-08-25 DIAGNOSIS — G893 Neoplasm related pain (acute) (chronic): Secondary | ICD-10-CM

## 2012-08-25 LAB — COMPREHENSIVE METABOLIC PANEL (CC13)
AST: 36 U/L — ABNORMAL HIGH (ref 5–34)
Albumin: 3.4 g/dL — ABNORMAL LOW (ref 3.5–5.0)
Alkaline Phosphatase: 109 U/L (ref 40–150)
BUN: 14.3 mg/dL (ref 7.0–26.0)
Glucose: 97 mg/dl (ref 70–99)
Potassium: 3.7 mEq/L (ref 3.5–5.1)
Total Bilirubin: 0.45 mg/dL (ref 0.20–1.20)

## 2012-08-25 MED ORDER — POTASSIUM CHLORIDE CRYS ER 20 MEQ PO TBCR: 20.0000 meq | EXTENDED_RELEASE_TABLET | Freq: Two times a day (BID) | ORAL | Status: DC

## 2012-08-25 NOTE — Telephone Encounter (Signed)
Gave pt appt for 7/24 , date per pt rqst, she also ask not to put lab for 6/18 ansd she will call Vicie Mutters, she wants home health nurse to draw labs when PICC line  is removed

## 2012-08-25 NOTE — Progress Notes (Signed)
Pt requests to have PICC pulled by [facility] RN at home. OK, per Dr. Truett Perna.

## 2012-08-25 NOTE — Progress Notes (Signed)
   [facility]    OFFICE PROGRESS NOTE   INTERVAL HISTORY:   She returns as scheduled. She reports an improved appetite and energy level. Her diet is now at approximately "80%" of normal. She complains of mild discomfort at the perineum and has noted mild constipation over the past week. The TNA has been weaned to off.  Objective:  Vital signs in last 24 hours:  Blood pressure 109/71, pulse 89, temperature 97.8 F (36.6 C), temperature source Oral, resp. rate 18, height 5' 3.5" (1.613 m), weight 102 lb 1.6 oz (46.312 kg).    HEENT: neck without mass Lymphatics: no cervical, supra-clavicular, or axillary nodes. Pea-sized bilateral inguinal nodes. No pathologic appearing inguinal lymph nodes. Resp: lungs clear bilaterally Cardio: regular rate and rhythm GI: no hepatomegaly, nontender. Small external hemorrhoid at the inferior anal verge. Vascular: no leg edema  Skin:resolving radiation hyperpigmentation at the groin and perineum. No skin breakdown.   Portacath/PICC-without erythema  Lab Results:  Lab Results  Component Value Date   WBC 4.2 07/19/2012   HGB 11.8 07/19/2012   HCT 33.1* 07/19/2012   MCV 90.2 07/19/2012   PLT 185 07/19/2012   ANC 3.0  Potassium 3.7, creatinine 0.7, bilirubin 0.45, albumin 3.4  Medications: I have reviewed the patient's current medications.  Assessment/Plan: 1. Rectal cancer. Low rectal cancer with probable involvement of the anal margin/sphincter musculature status post surgical biopsy 05/13/2012 confirming adenocarcinoma with lymphovascular invasion, uT3uN0. Staging PET scan 06/01/2012 revealed increased FDG activity at the rectum, increased FDG activity of multiple abdominal/pelvic lymph nodes including a retroperitoneal node between the aorta and IVC and left inguinal nodes. She began radiation and Xeloda on 06/09/2012. Xeloda and radiation were placed on hold during the 07/02/2012 hospitalization. Radiation was resumed. Xeloda was  resumed 07/12/2012. Concurrent Xeloda and radiation completed 07/23/2012. 2. Pain secondary to #1. Improved. She has discontinued OxyContin. 3. Intermittent nausea. Improved. 4. Firm mobile lymph node at the medial left inguinal region. Question malignant. No longer palpable 5. Hospitalization 07/02/2012 with possible dystonic reaction related to IV Phenergan. Resolved. 6. C. difficile colitis. She completed a course of oral vancomycin. The diarrhea has resolved 7. EGD 07/08/2012 with finding of a large amount of retained gastric contents. Question gastroparesis. Nausea and abdominal pain are better. She continues Reglan-now being tapered. 8. Anorexia/weight loss/early satiety.improved, no longer on TNA           Disposition:  Her performance status continues to improve. We will ask the [facility] RN to remove the PICC.   She is scheduled undergo surgery on 09/15/2012. We will see her 2-3 weeks after surgery to discuss adjuvant therapy.   Thornton Papas, MD  08/25/2012  6:03 PM

## 2012-08-27 ENCOUNTER — Telehealth: Payer: Self-pay | Admitting: *Deleted

## 2012-08-27 NOTE — Telephone Encounter (Signed)
F[facility]ed [facility] to [facility] for nurse to d/c PICC line on 08/30/12 visit. Draw blood for cmet prior to pulling line.

## 2012-08-30 ENCOUNTER — Telehealth: Payer: Self-pay | Admitting: *Deleted

## 2012-08-30 NOTE — Telephone Encounter (Signed)
[facility] notifying office that they will d/c PICC line today after drawing CMP and their services will be discontinued.  MD made aware.

## 2012-09-01 ENCOUNTER — Encounter: Payer: Self-pay | Admitting: Oncology

## 2012-09-02 ENCOUNTER — Ambulatory Visit
Admission: RE | Admit: 2012-09-02 | Discharge: 2012-09-02 | Disposition: A | Discharge status: Home / Self Care | Payer: BC Managed Care – PPO | Source: Ambulatory Visit | Attending: Radiation Oncology | Admitting: Radiation Oncology

## 2012-09-02 ENCOUNTER — Other Ambulatory Visit (INDEPENDENT_AMBULATORY_CARE_PROVIDER_SITE_OTHER): Payer: Self-pay | Admitting: Surgery

## 2012-09-02 VITALS — BP 118/74 | HR 67 | Temp 97.6°F | Ht 63.5 in | Wt 104.0 lb

## 2012-09-02 DIAGNOSIS — C20 Malignant neoplasm of rectum: Secondary | ICD-10-CM

## 2012-09-02 NOTE — Progress Notes (Addendum)
Leah Le here with her daughter for follow up after treatment for rectal cancer.  She denies pain and nausea.  She is waiting for surgery on 09/15/2012.  She does have fatigue.  Her appetite is improving.  She states that her skin in the treatment area is back to normal.

## 2012-09-02 NOTE — Progress Notes (Signed)
Radiation Oncology         [phone]) [phone] ________________________________  Name: Leah Le MRN: [id number]  Date: 09/02/2012  DOB: 1960-12-26  Follow-Up Visit Note  CC: Gweneth Dimitri, MD  Gweneth Dimitri, MD  Diagnosis:   Rectal cancer  Interval Since Last Radiation:  6 weeks   Narrative:  The patient returns today for routine follow-up.  The patient completed her course of radiotherapy on 07/23/2012. She had a very difficult time during treatment including ongoing nausea/decreased appetite. The patient is feeling much better now. Her PICC line has been removed. She has been gaining some weight and she indicates that she feels that her appetite is quite good at this point. No ongoing nausea. The patient also states that she is not having any pain in the anal/rectal region or any related skin irritation symptoms. The pain which was attributed to the tumor originally did go away during treatment and this has remained the case.  Overall the patient is doing quite well. She has been regaining her strength. She is tentatively scheduled for surgery on 09/15/2012.                              ALLERGIES:  is allergic to phenergan; tetanus toxoids; and benadryl.  Meds: Current Outpatient Prescriptions  Medication Sig Dispense Refill  . LORazepam (ATIVAN) 0.5 MG tablet Take 1 tablet (0.5 mg total) by mouth every 8 (eight) hours as needed for anxiety.  60 tablet  0  . metoCLOPramide (REGLAN) 10 MG tablet 10 mg. Take 10 mg by mouth 3 times daily.      . polyethylene glycol (MIRALAX / GLYCOLAX) packet Take by mouth 3 times daily.      Marland Kitchen saccharomyces boulardii (FLORASTOR) 250 MG capsule Take 2 capsules (500 mg total) by mouth 2 (two) times daily.  60 capsule  4  . saccharomyces boulardii (FLORASTOR) 250 MG capsule 250 mg. Take 250 mg by mouth 2 times daily.      . temazepam (RESTORIL) 7.5 MG capsule 7.5 mg. Take 7.5 mg by mouth nightly as needed for Sleep.      . potassium chloride SA  (K-DUR,KLOR-CON) 20 MEQ tablet Take 1 tablet (20 mEq total) by mouth 2 (two) times daily.  60 tablet  0   No current facility-administered medications for this encounter.    Physical Findings: The patient is in no acute distress. Patient is alert and oriented.  height is 5' 3.5" (1.613 m) and weight is 104 lb (47.174 kg). Her temperature is 97.6 F (36.4 C). Her blood pressure is 118/74 and her pulse is 67. .   Given the patient's recent physical exams and her clinical improvement, we deferred examination of the anal region today.  Lab Findings: Lab Results  Component Value Date   WBC 4.2 07/19/2012   HGB 11.8 07/19/2012   HCT 33.1* 07/19/2012   MCV 90.2 07/19/2012   PLT 185 07/19/2012     Radiographic Findings: No results found.  Impression:    The patient is doing quite well 1 month after completing her course of radiation with concurrent chemotherapy. However, the patient is still recovering as she had quite a difficult course and her nutritional status markedly decreased. Tentative schedule for surgery in early July.  Plan:  The patient will return to clinic in 4 months for ongoing followup. She is scheduled to see medical oncology after her surgery to discuss possible additional systemic treatment.  Jodelle Gross, M.D., Ph.D.

## 2012-09-07 ENCOUNTER — Telehealth: Payer: Self-pay | Admitting: *Deleted

## 2012-09-07 NOTE — Telephone Encounter (Signed)
Left VM that K+ is normal and per MD may decrease Ktab to  20 meq daily. Will recheck in July.

## 2012-09-08 ENCOUNTER — Encounter: Payer: Self-pay | Admitting: Oncology

## 2012-09-09 ENCOUNTER — Telehealth: Payer: Self-pay | Admitting: *Deleted

## 2012-09-09 NOTE — Telephone Encounter (Signed)
Patient left VM that she has not been on K+ at all since last time blood was drawn.  Attempted to call her back, but she was not at her cell # and no answer at home #. Will try to reach via e mail.

## 2012-09-13 ENCOUNTER — Telehealth: Payer: Self-pay | Admitting: *Deleted

## 2012-09-13 NOTE — Telephone Encounter (Signed)
Received fax from [facility]. Pt has been discharged from their services. Effective 08/30/12. Goals met.

## 2012-09-23 ENCOUNTER — Other Ambulatory Visit: Payer: Self-pay | Admitting: Nurse Practitioner

## 2012-09-23 ENCOUNTER — Other Ambulatory Visit: Payer: Self-pay | Admitting: *Deleted

## 2012-09-23 DIAGNOSIS — C20 Malignant neoplasm of rectum: Secondary | ICD-10-CM

## 2012-10-07 ENCOUNTER — Telehealth: Payer: Self-pay | Admitting: Oncology

## 2012-10-07 ENCOUNTER — Ambulatory Visit (HOSPITAL_BASED_OUTPATIENT_CLINIC_OR_DEPARTMENT_OTHER): Payer: BC Managed Care – PPO | Admitting: Oncology

## 2012-10-07 VITALS — BP 113/76 | HR 105 | Temp 97.9°F | Resp 18 | Ht 63.0 in | Wt 102.3 lb

## 2012-10-07 DIAGNOSIS — C20 Malignant neoplasm of rectum: Secondary | ICD-10-CM

## 2012-10-07 NOTE — Progress Notes (Signed)
[facility]    OFFICE PROGRESS NOTE   INTERVAL HISTORY:   She returns as scheduled. She underwent an APR by Dr. Byrd Hesselbach on 09/15/2012. No evidence of metastatic disease at the time of surgery . The pathology revealed limited residual tumor with adenocarcinoma invading into the muscularis propria. The resection margins were negative and 8 lymph nodes were negative for metastatic disease. A moderate treatment response was noted. Lymphovascular and perineural invasion were not identified. Immunohistochemistry stains showed no loss of expression of mismatch repair proteins.  She is recovering from surgery. She continues to have discomfort at the perineal wound. She remains weak.  She saw Dr. Kathrynn Ducking at [facility] for a second medical oncology opinion. Ms. Leah Le and her husband report Dr. Kathrynn Ducking recommends adjuvant 5-FU and oxaliplatin.  Objective:  Vital signs in last 24 hours:  Blood pressure 113/76, pulse 105, temperature 97.9 F (36.6 C), temperature source Oral, resp. rate 18, height 5\' 3"  (1.6 m), weight 102 lb 4.8 oz (46.403 kg).    HEENT: Neck without mass Lymphatics: No cervical, supra-clavicular, axillary, or inguinal nodes Resp: Lungs clear bilaterally Cardio: Regular rate and rhythm GI: No hepatomegaly, left lower quadrant colostomy, the perineal wound has almost completely healed Vascular: No leg edema    Medications: I have reviewed the patient's current medications.  Assessment/Plan: 1. Rectal cancer. Low rectal cancer with probable involvement of the anal margin/sphincter musculature status post surgical biopsy 05/13/2012 confirming adenocarcinoma with lymphovascular invasion, uT3uN0. Staging PET scan 06/01/2012 revealed increased FDG activity at the rectum, increased FDG activity of multiple abdominal/pelvic lymph nodes including a retroperitoneal node between the aorta and IVC and left inguinal nodes. She began radiation and Xeloda on 06/09/2012. Xeloda  and radiation were placed on hold during the 07/02/2012 hospitalization. Radiation was resumed. Xeloda was resumed 07/12/2012. Concurrent Xeloda and radiation completed 07/23/2012. -Status post an APR on 09/15/2012 with the final pathology confirming a ypT2, N0 tumor, no loss of expression of mismatch repair proteins, negative surgical margins 2. Pain secondary to #1. Improved. She has discontinued OxyContin. 3. Intermittent nausea. Improved. 4. Firm mobile lymph node at the medial left inguinal region. Question malignant. No longer palpable 5. Hospitalization 07/02/2012 with possible dystonic reaction related to IV Phenergan. Resolved. 6. C. difficile colitis. She completed a course of oral vancomycin. The diarrhea has resolved 7. EGD 07/08/2012 with finding of a large amount of retained gastric contents. ? Gastroparesis. She continues Reglan 8. Anorexia/weight loss/early satiety.improved, no longer on TNA   Disposition:  She is recovering from the APR procedure. I discussed adjuvant treatment options with Ms. Albarran and her husband. I recommend adjuvant systemic therapy based on the advanced clinical stage at presentation. We felt she had at least stage III disease based on the physical exam/PET scan findings at the time of diagnosis with the possibility of stage IV disease (metastatic lymph nodes outside of the pelvis).  I recommend adjuvant 5-fluorouracil and oxaliplatin-based therapy. I explained the lack of data to confirm a benefit of adjuvant oxaliplatin in patients with rectal cancer. However there is data to support the use of oxaliplatin in patients with node positive colon cancer. We discussed CAPOX and FOLFOX therapy. She is most comfortable proceeding with CAPOX. The toxicity she experienced during neoadjuvant therapy was most likely related to radiation in addition to capecitabine. We discussed specific toxicities associated with oxaliplatin including the chance of an allergic  reaction, hematologic toxicity, and neuropathy. We reviewed the various types of neuropathy associated with oxaliplatin. She  understands the potential for long-lasting neuropathy.  Ms. Bagot will return for an office visit and further discussion in 2 weeks. We discussed placement of a PICC or Port-A-Cath for the administration of chemotherapy. We also discussed the indication for obtaining a baseline PET or CT prior to adjuvant chemotherapy.  I will present her case at the GI tumor conference within the next few weeks.   Thornton Papas, MD  10/07/2012  1:51 PM

## 2012-10-07 NOTE — Telephone Encounter (Signed)
gv and printed appt sched and avs for pt  °

## 2012-10-08 ENCOUNTER — Other Ambulatory Visit: Payer: Self-pay | Admitting: *Deleted

## 2012-10-08 MED ORDER — CITALOPRAM HYDROBROMIDE 20 MG PO TABS: 20.0000 mg | ORAL_TABLET | Freq: Every day | ORAL | Status: DC

## 2012-10-08 NOTE — Telephone Encounter (Signed)
Alishea's sister Tresa Endo  who is with patient, called and asked about something for depression. They were here yesterday to see Dr. Truett Perna and states she is having a hard time with having to do chemotherapy. She is still very emotional about her recent surgery and now having to get chemotherapy. She would like to know if Dr. Truett Perna would prescribe her something for depression. Dr. Truett Perna is out of the office but I did speak with Dr. Cyndie Chime. He prescribed Celexa 20 mg. She is to take one daily. I discussed this with Herbert Seta and Tresa Endo. I explained it will take not take effect immediately but in a week she should begin to feel better. I asked them to call if any questions or concerns. They voiced understanding.

## 2012-10-12 ENCOUNTER — Encounter: Payer: Self-pay | Admitting: *Deleted

## 2012-10-18 ENCOUNTER — Encounter (INDEPENDENT_AMBULATORY_CARE_PROVIDER_SITE_OTHER): Payer: Self-pay

## 2012-10-19 ENCOUNTER — Ambulatory Visit (HOSPITAL_BASED_OUTPATIENT_CLINIC_OR_DEPARTMENT_OTHER): Payer: BC Managed Care – PPO | Admitting: Oncology

## 2012-10-19 ENCOUNTER — Telehealth: Payer: Self-pay | Admitting: Oncology

## 2012-10-19 VITALS — BP 109/65 | HR 91 | Resp 19 | Ht 63.0 in | Wt 105.6 lb

## 2012-10-19 DIAGNOSIS — C20 Malignant neoplasm of rectum: Secondary | ICD-10-CM

## 2012-10-19 DIAGNOSIS — F329 Major depressive disorder, single episode, unspecified: Secondary | ICD-10-CM

## 2012-10-19 DIAGNOSIS — G893 Neoplasm related pain (acute) (chronic): Secondary | ICD-10-CM

## 2012-10-19 NOTE — Telephone Encounter (Signed)
gv pt appt schedule for August. Added lb for 8/7. [facility] will contact pt w/ct. Per 8/5 pof f/u to be scheduled.

## 2012-10-19 NOTE — Progress Notes (Signed)
   [facility]    OFFICE PROGRESS NOTE   INTERVAL HISTORY:   She returns for scheduled visit. She continues to have pain at the perineum. It is difficult to sit down. She takes Tylenol for the discomfort. Leah Le reports feeling "depressed ". She was started on Celexa last week, but this caused nausea. She took only 1 dose. The perineal wound is healing. She reports developing a low-grade fever and diffuse "aching " beginning on 10/17/2012. No fever today.  Objective:  Vital signs in last 24 hours:  Blood pressure 109/65, pulse 91, resp. rate 19, height 5\' 3"  (1.6 m), weight 105 lb 9.6 oz (47.9 kg).    Resp: Lungs clear bilaterally Cardio: Regular rate and rhythm GI: No hepatomegaly, left lower quadrant colostomy Vascular: No leg edema  Skin: There is a 3-4 mm open area at the superior aspect of the perineal wound. No evidence of infection. No fluctuance over the gluteus  Medications: I have reviewed the patient's current medications.  Assessment/Plan: 1. Rectal cancer. Low rectal cancer with probable involvement of the anal margin/sphincter musculature status post surgical biopsy 05/13/2012 confirming adenocarcinoma with lymphovascular invasion, uT3uN0. Staging PET scan 06/01/2012 revealed increased FDG activity at the rectum, increased FDG activity of multiple abdominal/pelvic lymph nodes including a retroperitoneal node between the aorta and IVC and left inguinal nodes. She began radiation and Xeloda on 06/09/2012. Xeloda and radiation were placed on hold during the 07/02/2012 hospitalization. Radiation was resumed. Xeloda was resumed 07/12/2012. Concurrent Xeloda and radiation completed 07/23/2012. -Status post an APR on 09/15/2012 with the final pathology confirming a ypT2, N0 tumor, no loss of expression of mismatch repair proteins, negative surgical margins  2. Pain secondary to #1. Improved. She has discontinued OxyContin. 3. Intermittent nausea.  Improved. 4. Firm mobile lymph node at the medial left inguinal region. Question malignant. No longer palpable 5. Hospitalization 07/02/2012 with possible dystonic reaction related to IV Phenergan. Resolved. 6. C. difficile colitis. She completed a course of oral vancomycin. The diarrhea has resolved 7. EGD 07/08/2012 with finding of a large amount of retained gastric contents. ? Gastroparesis. She continues Reglan 8. Anorexia/weight loss/early satiety.improved, no longer on TNA 9. Depression-she declines an antidepressant  Disposition:  I had a long discussion with Ms. Cenci today regarding adjuvant treatment options. I feel she is at high-risk of developing recurrent rectal cancer. We discussed adjuvant capecitabine, CAPOX, and FOLFOX. We discussed the lack of randomized data to support a survival benefit for oxaliplatin in patients with resected rectal cancer. We extrapolate from the colon cancer literature to recommend oxaliplatin  in node-positive rectal cancer. We discussed the small expected absolute benefit with oxaliplatin in this setting.  She is concerned about the possibility of neuropathy with oxaliplatin. I explained the small chance of developing long-lasting neuropathy.  She did not make a final decision on adjuvant chemotherapy today. She does not feel ready to begin therapy at present. I explained there is data to support early administration of adjuvant therapy. She plans to take a vacation out of town next week. I recommend she begin adjuvant therapy following week.  Ms. Cespedes committed to contact us within the next few days with her decision on adjuvant therapy. We will arrange for placement of a Port-A-Cath if she agrees to proceed with oxaliplatin-based therapy. She will be scheduled for a restaging CT evaluation for within the next few days.   Thornton Papas, MD  10/19/2012  5:07 PM

## 2012-10-21 ENCOUNTER — Telehealth: Payer: Self-pay | Admitting: Oncology

## 2012-10-21 ENCOUNTER — Telehealth: Payer: Self-pay | Admitting: *Deleted

## 2012-10-21 ENCOUNTER — Other Ambulatory Visit: Payer: Self-pay | Admitting: *Deleted

## 2012-10-21 ENCOUNTER — Other Ambulatory Visit (HOSPITAL_COMMUNITY): Payer: BC Managed Care – PPO

## 2012-10-21 ENCOUNTER — Other Ambulatory Visit: Payer: BC Managed Care – PPO | Admitting: Lab

## 2012-10-21 NOTE — Telephone Encounter (Signed)
Patient returned call to this RN.  She stated "I have made some phone calls and have decided that I do not want to have the CT scan any earlier than 11/01/12". " I have decided not to go out of town and will call the doctor if I start feeling bad again".  She stated she would like to get an appointment with Dr. Truett Perna after the CT scan to discuss results and to discuss further treatment.  She stated she is still trying to make up her mind completely about what type of treatment she is going to take, but that she is leaning towards what Dr. Truett Perna recommended.  She stated she knows that Dr. Truett Perna wants her to start treatment soon and to go ahead and get it scheduled, but that "I need at least a week to just feel good and normal".  This RN thanked patient for calling and encouraged her to call if she changes her mind.  This RN told patient that she should receive a call from the scheduler with new appointments.  Dr. Truett Perna was notified.  No new orders received.

## 2012-10-21 NOTE — Telephone Encounter (Signed)
Patient left this RN a message stating she had cancelled her CT scan because she had more fevers during the night after seeing Dr. Truett Perna on 10/19/12.  This RN returned phone call to patient.  She stated she had fevers up to 101.2  off and on during the night on 10/19/12.  She stated she had a "low-grade" temp and felt aches most of day on 10/20/12.  She stated she starting feeling better by 6pm on 10/20/12.  She stated she started taking Tylenol around the clock beginning bedtime on 10/20/12.  She stated that she cancelled her CT scan for 10/21/12 due to feeling bad.  Patient stated she feels much better today and is going out of town on 10/22/12 for a week and that she re-scheduled the CT scan  for 11/01/12. This RN gave above information to Dr. Truett Perna who stated he wanted patient to have the CT scan this week, before going out of town, due to the pain and low grade fevers she had been experiencing.  This information was given to patient.  Patient stated she did not think she needed to have the CT scan now because she was feeling better.  Per Dr. Truett Perna, it is his recommendation that the patient have the CT scan this week.  This was again reiterated to the patient.  Patient stated she wanted to contact Dr. Byrd Hesselbach at [facility] and ask his opinion about whether she should have the CT scan or not.  She stated she would call this RN back and let Dr. Truett Perna know if she agrees to have CT this week or have on 11/01/12 after she is back in town.

## 2012-10-22 ENCOUNTER — Telehealth: Payer: Self-pay | Admitting: Oncology

## 2012-10-22 NOTE — Telephone Encounter (Signed)
, °

## 2012-11-01 ENCOUNTER — Ambulatory Visit (HOSPITAL_BASED_OUTPATIENT_CLINIC_OR_DEPARTMENT_OTHER): Payer: BC Managed Care – PPO | Admitting: Lab

## 2012-11-01 ENCOUNTER — Encounter (HOSPITAL_COMMUNITY): Payer: Self-pay

## 2012-11-01 ENCOUNTER — Ambulatory Visit (HOSPITAL_COMMUNITY)
Admission: RE | Admit: 2012-11-01 | Discharge: 2012-11-01 | Disposition: A | Discharge status: Home / Self Care | Payer: BC Managed Care – PPO | Source: Ambulatory Visit | Attending: Oncology | Admitting: Oncology

## 2012-11-01 DIAGNOSIS — Z923 Personal history of irradiation: Secondary | ICD-10-CM | POA: Insufficient documentation

## 2012-11-01 DIAGNOSIS — C20 Malignant neoplasm of rectum: Secondary | ICD-10-CM | POA: Insufficient documentation

## 2012-11-01 DIAGNOSIS — Z9221 Personal history of antineoplastic chemotherapy: Secondary | ICD-10-CM | POA: Insufficient documentation

## 2012-11-01 DIAGNOSIS — M8530 Osteitis condensans, unspecified site: Secondary | ICD-10-CM | POA: Insufficient documentation

## 2012-11-01 DIAGNOSIS — Z933 Colostomy status: Secondary | ICD-10-CM | POA: Insufficient documentation

## 2012-11-01 LAB — COMPREHENSIVE METABOLIC PANEL (CC13)
ALT: 14 U/L (ref 0–55)
AST: 17 U/L (ref 5–34)
Calcium: 9.2 mg/dL (ref 8.4–10.4)
Chloride: 103 mEq/L (ref 98–109)
Creatinine: 0.7 mg/dL (ref 0.6–1.1)
Potassium: 3.8 mEq/L (ref 3.5–5.1)
Sodium: 142 mEq/L (ref 136–145)
Total Protein: 6.9 g/dL (ref 6.4–8.3)

## 2012-11-01 LAB — CBC WITH DIFFERENTIAL/PLATELET
BASO%: 0.4 % (ref 0.0–2.0)
EOS%: 1.4 % (ref 0.0–7.0)
MCH: 30.5 pg (ref 25.1–34.0)
MCHC: 34.2 g/dL (ref 31.5–36.0)
MONO#: 0.5 10*3/uL (ref 0.1–0.9)
NEUT%: 86.8 % — ABNORMAL HIGH (ref 38.4–76.8)
RBC: 3.49 10*6/uL — ABNORMAL LOW (ref 3.70–5.45)
RDW: 12.9 % (ref 11.2–14.5)
WBC: 6.6 10*3/uL (ref 3.9–10.3)
lymph#: 0.3 10*3/uL — ABNORMAL LOW (ref 0.9–3.3)

## 2012-11-01 MED ORDER — IOHEXOL 300 MG/ML  SOLN
50.0000 mL | Freq: Once | INTRAMUSCULAR | Status: AC | PRN
  Administered 2012-11-01: 50 mL via ORAL

## 2012-11-01 MED ORDER — IOHEXOL 300 MG/ML  SOLN
100.0000 mL | Freq: Once | INTRAMUSCULAR | Status: AC | PRN
  Administered 2012-11-01: 100 mL via INTRAVENOUS

## 2012-11-02 ENCOUNTER — Telehealth: Payer: Self-pay | Admitting: *Deleted

## 2012-11-02 ENCOUNTER — Other Ambulatory Visit: Payer: Self-pay | Admitting: *Deleted

## 2012-11-02 DIAGNOSIS — C20 Malignant neoplasm of rectum: Secondary | ICD-10-CM

## 2012-11-02 NOTE — Telephone Encounter (Signed)
Per request of Dr. Truett Perna, patient was notified of CT results.  She was appreciative of the call and is aware of her appointment on 11/08/12.

## 2012-11-03 ENCOUNTER — Telehealth: Payer: Self-pay | Admitting: *Deleted

## 2012-11-03 NOTE — Telephone Encounter (Signed)
Patient phoned stating she needs Dr. Truett Perna to write a letter to her employer to extend her LOA through Nov. 2014 due to needing further treatment.  Dr. Truett Perna informed.

## 2012-11-08 ENCOUNTER — Ambulatory Visit (HOSPITAL_BASED_OUTPATIENT_CLINIC_OR_DEPARTMENT_OTHER): Payer: BC Managed Care – PPO | Admitting: Nurse Practitioner

## 2012-11-08 ENCOUNTER — Encounter: Payer: Self-pay | Admitting: Oncology

## 2012-11-08 ENCOUNTER — Telehealth: Payer: Self-pay | Admitting: Oncology

## 2012-11-08 VITALS — BP 111/80 | HR 76 | Temp 97.9°F | Resp 18 | Ht 63.0 in | Wt 107.2 lb

## 2012-11-08 DIAGNOSIS — C20 Malignant neoplasm of rectum: Secondary | ICD-10-CM

## 2012-11-08 NOTE — Progress Notes (Addendum)
OFFICE PROGRESS NOTE  Interval history:  Leah Le returns as scheduled. She continues to have pressure/pain especially with sitting. She is taking Tylenol as needed. Perineal wound continues to heal. She notes drainage from the wound. She has a good appetite. No nausea or vomiting. Colostomy is functioning.  She has decided to proceed with CAPOX chemotherapy. She plans to discontinue the oxaliplatin portion of treatment if she develops peripheral neuropathy symptoms.   Objective: Blood pressure 111/80, pulse 76, temperature 97.9 F (36.6 C), temperature source Oral, resp. rate 18, height 5\' 3"  (1.6 m), weight 107 lb 3.2 oz (48.626 kg).  No thrush or ulcerations. Lungs are clear. Regular cardiac rhythm. Abdomen soft and nontender. No hepatomegaly. Left lower quadrant colostomy. Extremities without edema. Approximate 2-3 mm open area at the superior aspect of the perineal wound with serous drainage. Vibratory sense mildly decreased over the fingertips on the right hand and intact over the fingertips on the left hand.  Lab Results: Lab Results  Component Value Date   WBC 6.6 11/01/2012   HGB 10.6* 11/01/2012   HCT 31.1* 11/01/2012   MCV 89.1 11/01/2012   PLT 400 11/01/2012    Chemistry:    Chemistry      Component Value Date/Time   NA 142 11/01/2012 1407   NA 135 07/11/2012 0438   K 3.8 11/01/2012 1407   K 3.9 07/11/2012 0438   CL 106 08/25/2012 1143   CL 105 07/11/2012 0438   CO2 27 11/01/2012 1407   CO2 23 07/11/2012 0438   BUN 10.3 11/01/2012 1407   BUN <3* 07/11/2012 0438   CREATININE 0.7 11/01/2012 1407   CREATININE 0.52 07/11/2012 0438      Component Value Date/Time   CALCIUM 9.2 11/01/2012 1407   CALCIUM 8.5 07/11/2012 0438   ALKPHOS 184* 11/01/2012 1407   ALKPHOS 39 07/05/2012 0409   AST 17 11/01/2012 1407   AST 16 07/05/2012 0409   ALT 14 11/01/2012 1407   ALT 12 07/05/2012 0409   BILITOT 0.31 11/01/2012 1407   BILITOT 0.3 07/05/2012 0409       Studies/Results: Ct Chest W  Contrast  11/01/2012   CLINICAL DATA:  Restaging rectal cancer initially diagnosed in February 2013. Chemotherapy and radiation therapy completed.  EXAM: CT CHEST, ABDOMEN, AND PELVIS WITH CONTRAST  TECHNIQUE: Multidetector CT imaging of the chest, abdomen and pelvis was performed following the standard protocol during bolus administration of intravenous contrast.  CONTRAST:  50mL OMNIPAQUE IOHEXOL 300 MG/ML SOLN, OMNIPAQUE IOHEXOL 300 MG/ML SOLN  COMPARISON:  PET-CT 06/01/2012. CT's of the chest, abdomen and pelvis 05/20/2012.  FINDINGS: CT CHEST FINDINGS  There are no enlarged mediastinal, hilar or axillary lymph nodes. The mediastinum appears normal. There are bilateral breast implants.  There is no pleural or pericardial effusion. The lungs are clear. There are no worrisome osseous findings.  CT ABDOMEN AND PELVIS FINDINGS  The liver, gallbladder, biliary system and pancreas appear normal. There is no adrenal mass. The spleen and kidneys appear normal without hydronephrosis.  There has been interval substantial improvement in the previously demonstrated enlarged retroperitoneal pelvic lymph nodes which were hypermetabolic on PET-CT. Currently, no pathologically enlarged nodes are identified. Distal colon resection and descending colostomy have been performed. There is no evidence of bowel obstruction or recurrent mucosal lesion. There is a moderate amount of free fluid between the uterus and the sacrum, and there is soft tissue stranding in the posterior pelvic fat. No other focal fluid collections are identified. The stomach  and small bowel appear normal.  The uterus, ovaries and bladder appear unremarkable.  There are no worrisome osseous findings. Mild osteitis pubis appears stable.  IMPRESSION: CT CHEST IMPRESSION  Stable chest CT. No evidence of metastatic disease or acute process.  CT ABDOMEN AND PELVIS IMPRESSION  Interval distal colon resection and descending colostomy without demonstrated  complication. There is nonspecific presacral fluid with soft tissue stranding in the posterior pelvis, likely related to interval radiation therapy. No evidence of recurrent mass or metastatic disease.   Electronically Signed   By: Roxy Horseman   On: 11/01/2012 16:17   Ct Abdomen Pelvis W Contrast  11/01/2012   CLINICAL DATA:  Restaging rectal cancer initially diagnosed in February 2013. Chemotherapy and radiation therapy completed.  EXAM: CT CHEST, ABDOMEN, AND PELVIS WITH CONTRAST  TECHNIQUE: Multidetector CT imaging of the chest, abdomen and pelvis was performed following the standard protocol during bolus administration of intravenous contrast.  CONTRAST:  50mL OMNIPAQUE IOHEXOL 300 MG/ML SOLN, OMNIPAQUE IOHEXOL 300 MG/ML SOLN  COMPARISON:  PET-CT 06/01/2012. CT's of the chest, abdomen and pelvis 05/20/2012.  FINDINGS: CT CHEST FINDINGS  There are no enlarged mediastinal, hilar or axillary lymph nodes. The mediastinum appears normal. There are bilateral breast implants.  There is no pleural or pericardial effusion. The lungs are clear. There are no worrisome osseous findings.  CT ABDOMEN AND PELVIS FINDINGS  The liver, gallbladder, biliary system and pancreas appear normal. There is no adrenal mass. The spleen and kidneys appear normal without hydronephrosis.  There has been interval substantial improvement in the previously demonstrated enlarged retroperitoneal pelvic lymph nodes which were hypermetabolic on PET-CT. Currently, no pathologically enlarged nodes are identified. Distal colon resection and descending colostomy have been performed. There is no evidence of bowel obstruction or recurrent mucosal lesion. There is a moderate amount of free fluid between the uterus and the sacrum, and there is soft tissue stranding in the posterior pelvic fat. No other focal fluid collections are identified. The stomach and small bowel appear normal.  The uterus, ovaries and bladder appear unremarkable.  There  are no worrisome osseous findings. Mild osteitis pubis appears stable.  IMPRESSION: CT CHEST IMPRESSION  Stable chest CT. No evidence of metastatic disease or acute process.  CT ABDOMEN AND PELVIS IMPRESSION  Interval distal colon resection and descending colostomy without demonstrated complication. There is nonspecific presacral fluid with soft tissue stranding in the posterior pelvis, likely related to interval radiation therapy. No evidence of recurrent mass or metastatic disease.   Electronically Signed   By: Roxy Horseman   On: 11/01/2012 16:17    Medications: I have reviewed the patient's current medications.  Assessment/Plan:  1. Rectal cancer. Low rectal cancer with probable involvement of the anal margin/sphincter musculature status post surgical biopsy 05/13/2012 confirming adenocarcinoma with lymphovascular invasion, uT3uN0. Staging PET scan 06/01/2012 revealed increased FDG activity at the rectum, increased FDG activity of multiple abdominal/pelvic lymph nodes including a retroperitoneal node between the aorta and IVC and left inguinal nodes. She began radiation and Xeloda on 06/09/2012. Xeloda and radiation were placed on hold during the 07/02/2012 hospitalization. Radiation was resumed. Xeloda was resumed 07/12/2012. Concurrent Xeloda and radiation completed 07/23/2012.  Status post an APR on 09/15/2012 with the final pathology confirming a ypT2, N0 tumor, no loss of expression of mismatch repair proteins, negative surgical margins.   CT chest/abdomen/pelvis on 11/01/2012 showed no evidence of metastatic disease or acute process in the chest. Interval substantial improvement in the previously demonstrated enlarged  retroperitoneal pelvic lymph nodes which were hypermetabolic on PET-CT. No pathologically enlarged lymph nodes were identified. Distal colon resection and descending colostomy noted. No evidence of bowel obstruction or recurrent mucosal lesion. A moderate amount of free fluid  between the uterus and the sacrum and soft tissue stranding in the posterior pelvic fat. 2. Pain secondary to #1. Improved. She has discontinued OxyContin. 3. Intermittent nausea. Improved. She denies nausea at today's visit. 4. Firm mobile lymph node at the medial left inguinal region. Question malignant. No longer palpable. 5. Hospitalization 07/02/2012 with possible dystonic reaction related to IV Phenergan. Resolved. 6. C. difficile colitis. She completed a course of oral vancomycin. The diarrhea has resolved. 7. EGD 07/08/2012 with finding of a large amount of retained gastric contents. ? Gastroparesis. She continues Reglan. 8. Anorexia/weight loss/early satiety. Improved, no longer on TNA. 9. Depression-she declined an antidepressant.  Disposition-she appears stable. She has decided to proceed with CAPOX chemotherapy. She states she will discontinue the oxaliplatin if she develops symptoms of peripheral neuropathy. We discussed the potential for development of peripheral neuropathy symptoms following completion of the planned course of chemotherapy.  We again reviewed potential toxicities associated with oxaliplatin including myelosuppression, nausea and neurotoxicity (cold sensitivity, peripheral neuropathy, acute laryngopharyngeal dysesthesia and more rare occurrences of diplopia, ataxia). We also reviewed potential toxicities associated with Xeloda including mouth sores, nausea, diarrhea, hand-foot syndrome.  We made a referral to Dr. Harlon Flor for Port-A-Cath placement.   She will return to begin cycle one CAPOX on 11/18/2012. She will return for a followup visit with Dr. Truett Perna and cycle 2 on 12/09/2012. She will contact the office in the interim with any problems.  She has a followup visit with Dr. Byrd Hesselbach later this week and will discuss the persistent pressure/pain with sitting.  Patient seen with Dr. Truett Perna.  Leah Le ANP/GNP-BC   This was a shared visit with Leah Le.  The restaging CT evaluation shows no evidence of progressive rectal cancer. The fluid collection in the pelvis is likely a postoperative finding. We discussed adjuvant treatment options again today with Ms. Buczek. Her case was presented at the GI tumor conference on 11/03/2012. Review of the pretreatment PET scan is consistent with metastatic disease involving lymph nodes.  She agrees to treatment with CAPOX. She understands the potential for developing neuropathy during and following treatment. The plan is to initiate CAPOX therapy as soon as a Port-A-Cath can be placed. She will see Dr. Byrd Hesselbach later this week. We recommended she review the 11/01/2012 pelvic CT with Dr. Byrd Hesselbach.    Leah Le, M.D.

## 2012-11-08 NOTE — Telephone Encounter (Signed)
Gave pt appt for lab and Md , emailed Leah Le regarding chemo, chemo class tomorrow

## 2012-11-09 ENCOUNTER — Encounter: Payer: Self-pay | Admitting: Medical Oncology

## 2012-11-09 ENCOUNTER — Other Ambulatory Visit: Payer: BC Managed Care – PPO

## 2012-11-09 ENCOUNTER — Telehealth: Payer: Self-pay | Admitting: *Deleted

## 2012-11-09 ENCOUNTER — Encounter: Payer: Self-pay | Admitting: *Deleted

## 2012-11-09 ENCOUNTER — Telehealth: Payer: Self-pay

## 2012-11-09 ENCOUNTER — Other Ambulatory Visit (INDEPENDENT_AMBULATORY_CARE_PROVIDER_SITE_OTHER): Payer: Self-pay | Admitting: Surgery

## 2012-11-09 MED ORDER — ONDANSETRON HCL 8 MG PO TABS: 8.0000 mg | ORAL_TABLET | Freq: Three times a day (TID) | ORAL | Status: DC | PRN

## 2012-11-09 MED ORDER — LIDOCAINE-PRILOCAINE 2.5-2.5 % EX CREA: TOPICAL_CREAM | CUTANEOUS | Status: DC | PRN

## 2012-11-09 NOTE — Telephone Encounter (Signed)
Gave pt appt for lab, Md and chemo for September 2014 °

## 2012-11-09 NOTE — Telephone Encounter (Signed)
Per staff message and POF I have scheduled appts.  JMW  

## 2012-11-11 ENCOUNTER — Telehealth: Payer: Self-pay | Admitting: *Deleted

## 2012-11-11 NOTE — Telephone Encounter (Signed)
Received phone call from patient stating she had been struggling since her last meeting with Lisa/Dr. Truett Perna.  She stated she came to the "realization" on 11/09/12 that she was not 100% on board with taking the Oxaliplatin treatment.  She stated she was willing to take the Xeloda, but that on 11/09/12 she decided NOT to take Oxaliplatin.  She stated she discussed this with her husband and family and that they were all supportive of her decision. She stated she would like to proceed with the Xeloda and that she was going to focus on good nutrition and taking care of mind, body, and spirit.  She stated she has a very strong faith and that she knew it would help in her healing.  This RN asked patient if she wanted to come in and discuss further with Dr. Truett Perna, SW, Chaplain, or other support personal and she declined. "I have made my decision and I am at peace about it", she stated.  This RN thanked patient for calling and reminded her to please call back if she changes her mind or wants to discuss further with MD.  This RN informed Dr. Truett Perna of above conversation.  Dr. Truett Perna stated he was waiting to hear of her treatment decision and would go ahead and send out Rx for Xeloda.

## 2012-11-12 ENCOUNTER — Other Ambulatory Visit: Payer: Self-pay | Admitting: *Deleted

## 2012-11-12 ENCOUNTER — Telehealth: Payer: Self-pay | Admitting: *Deleted

## 2012-11-12 ENCOUNTER — Encounter: Payer: Self-pay | Admitting: Oncology

## 2012-11-12 DIAGNOSIS — C20 Malignant neoplasm of rectum: Secondary | ICD-10-CM

## 2012-11-12 MED ORDER — CAPECITABINE 500 MG PO TABS: ORAL_TABLET | ORAL | Status: DC

## 2012-11-12 NOTE — Telephone Encounter (Addendum)
Spoke with patient by phone.  She stated she saw Dr. Byrd Hesselbach yesterday and that she is ready to begin treatment with Xeloda only, no Oxaliplatin.  She stated she would contact this office once she receives the Xeloda and will give this office a start date.  She understands the directions to take the drug and she understands the dosage.  She understands her appointment schedule and return visit with Dr. Truett Perna.  She had no further questions and said she would call if any questions come up.  Will notify Dr. Truett Perna and Dr. Kalman Drape nurse notified of above.

## 2012-11-12 NOTE — Progress Notes (Addendum)
Xeloda Rx to [facility] via [facility].  Leah Le could not fill this so I faxed it to [facility] [phone] Fax #; [phone] Phone #. On 11/12/12.

## 2012-11-12 NOTE — Progress Notes (Signed)
Script for Xeloda to managed care department. If she chooses not to pursue IV chemo, Dr. Truett Perna wants her to begin taking Xeloda as soon as she receives it. Vicie Mutters, RN notified and will follow up with patient today regarding her decision.

## 2012-11-15 ENCOUNTER — Other Ambulatory Visit: Payer: Self-pay | Admitting: Oncology

## 2012-11-17 ENCOUNTER — Encounter (HOSPITAL_BASED_OUTPATIENT_CLINIC_OR_DEPARTMENT_OTHER): Admission: RE | Payer: Self-pay | Source: Ambulatory Visit

## 2012-11-17 ENCOUNTER — Ambulatory Visit (HOSPITAL_BASED_OUTPATIENT_CLINIC_OR_DEPARTMENT_OTHER): Admission: RE | Admit: 2012-11-17 | Payer: BC Managed Care – PPO | Source: Ambulatory Visit | Admitting: Surgery

## 2012-11-17 ENCOUNTER — Ambulatory Visit (INDEPENDENT_AMBULATORY_CARE_PROVIDER_SITE_OTHER): Payer: BC Managed Care – PPO | Admitting: Surgery

## 2012-11-17 ENCOUNTER — Telehealth: Payer: Self-pay | Admitting: *Deleted

## 2012-11-17 ENCOUNTER — Encounter (INDEPENDENT_AMBULATORY_CARE_PROVIDER_SITE_OTHER): Payer: Self-pay | Admitting: Surgery

## 2012-11-17 DIAGNOSIS — C20 Malignant neoplasm of rectum: Secondary | ICD-10-CM

## 2012-11-17 SURGERY — INSERTION, TUNNELED CENTRAL VENOUS DEVICE, WITH PORT
Anesthesia: General

## 2012-11-17 NOTE — Telephone Encounter (Signed)
RECEIVED A FAX FROM [facility] CONCERNING A CONFIRMATION OF A PRESCRIPTION SHIPMENT FOR Capecitabine , SCHEDULED FOR DELIVERY ON 11/17/12.

## 2012-11-17 NOTE — Progress Notes (Signed)
S/p APR by Dr. Mirian Mo at [facility] in July 2014.  She is here for a wound check of her perineal incision.  She has a small opening that has been draining some serous fluid.  They are having some difficulty packing the wound.  She is scheduled to have a repeat CT scan and follow-up visit with Dr. Byrd Hesselbach in the near future.  Her abdominal incision is well-healed with no sign of infection.  Her ostomy is protruding nicely and is functioning well. Her perineal incision is mostly intact with normal-appearing surrounding skin.  Anteriorly, there is a small pinhole opening with no drainage seen.  In the mid-portion of the incision, there is a 7 mm opening.  I gently probed this area with a moistened cotton swab.  This tunnel tracks posteriorly for a distance of about 1.5 cm.  The sides of the tunnel are well-granulated.  No foul-smelling or purulent drainage noted.  I repacked the wound with 1/2 inch NuGauze.  A dry dressing was applied.  The patient's daughter will continue assisting her with these dressing changes BID.  We will be glad to see her back as needed.  She is scheduled to start Xeloda tomorrow.     Wilmon Arms. Corliss Skains, MD, [facility]  General/ Trauma Surgery  11/17/2012 11:53 AM

## 2012-11-18 ENCOUNTER — Ambulatory Visit: Payer: BC Managed Care – PPO

## 2012-11-26 ENCOUNTER — Other Ambulatory Visit: Payer: Self-pay | Admitting: Nurse Practitioner

## 2012-11-26 ENCOUNTER — Telehealth: Payer: Self-pay | Admitting: *Deleted

## 2012-11-26 DIAGNOSIS — C20 Malignant neoplasm of rectum: Secondary | ICD-10-CM

## 2012-11-26 NOTE — Telephone Encounter (Signed)
Received message from pt reports, "I've got a low grade fever of 99.9 & achy. My son was dx 9/11 with strep throat and just wanted MD to be aware since weekend coming up"  Per Dr. Truett Perna, called and spoke with pt instructing her to push fluids and call this weekend if temp = or greater than 100.5.  Pt verbalized understanding and stated she is tolerating the Xeloda well without any problems.

## 2012-11-30 ENCOUNTER — Ambulatory Visit (INDEPENDENT_AMBULATORY_CARE_PROVIDER_SITE_OTHER): Payer: 59 | Admitting: Surgery

## 2012-12-01 ENCOUNTER — Encounter: Payer: Self-pay | Admitting: Oncology

## 2012-12-07 ENCOUNTER — Telehealth: Payer: Self-pay | Admitting: *Deleted

## 2012-12-07 NOTE — Telephone Encounter (Signed)
Spoke with patient by phone.  She had a drain placed by Dr. Byrd Hesselbach last week at [facility].  She stated the drain is in place now, and that the discomfort in her buttock is much better.  She stated she took the full course of Xeloda for 14 days beginning 11/18/12.  She stated she tolerated it without side effects.  She will bring her Xeloda pills with her on 12/09/12 to start taking after she has been assessed by MD.  She voiced no complaints and was appreciative of the call.

## 2012-12-08 ENCOUNTER — Encounter: Payer: Self-pay | Admitting: *Deleted

## 2012-12-08 NOTE — Progress Notes (Signed)
Faxed confirmation from [facility] that shipment of Xeloda was sent 12/07/12 for next day delivery.

## 2012-12-09 ENCOUNTER — Ambulatory Visit: Payer: BC Managed Care – PPO | Admitting: Oncology

## 2012-12-09 ENCOUNTER — Telehealth: Payer: Self-pay | Admitting: *Deleted

## 2012-12-09 ENCOUNTER — Ambulatory Visit: Payer: BC Managed Care – PPO

## 2012-12-09 ENCOUNTER — Other Ambulatory Visit: Payer: BC Managed Care – PPO | Admitting: Lab

## 2012-12-09 NOTE — Telephone Encounter (Signed)
Received phone call from patient's husband stating that his wife, Leah Le, woke up during the night with severe abdominal pain.  He stated they called the surgeon, Dr. Byrd Hesselbach, who told them to come to the ED at [facility].  They ended up going to the ED via ambulance because Leah Le passed out.  Her husband stated they would call and update this office with what was going on.  This RN reminded the patient's husband that Dr. Truett Perna would be available to speak with Dr. Byrd Hesselbach if needed.  This RN thanked Leah Le for calling and informed Dr. Truett Perna.

## 2012-12-10 ENCOUNTER — Other Ambulatory Visit: Payer: Self-pay | Admitting: *Deleted

## 2012-12-10 ENCOUNTER — Telehealth: Payer: Self-pay | Admitting: *Deleted

## 2012-12-10 ENCOUNTER — Telehealth: Payer: Self-pay | Admitting: Oncology

## 2012-12-10 NOTE — Telephone Encounter (Signed)
Husband left VM requesting call back. Patient wants to get started on her next cycle of Xeloda today.

## 2012-12-10 NOTE — Telephone Encounter (Signed)
Talked to pt and gavwe her appt for 10/2 lab and Ml

## 2012-12-10 NOTE — Telephone Encounter (Signed)
Spoke with patient and gave her OK to start her next cycle of Xeloda today after Dr. Truett Perna had reviewed her labs from [facility] yesterday. Confirmed dose at 1500 mg am/ 1000 mg pm. She will start at lunch today. Still having some abdominal discomfort, but is getting better. MD wants her seen in office with lab in 1 week and she agrees. POF to scheduler.

## 2012-12-10 NOTE — Telephone Encounter (Signed)
Drain was removed at [facility] was that the position of the tube was causing her pain. The fluid that was drained last week returned back negative for any infection. She is feeling better today and was up fixing breakfast for boys this morning. Wants to start next cycle of chemo, but does not want to come in for labs today. Had lab work at [facility]. Could we get results from there? Call Maylin with directions at [phone] and if you can't reach her call him back.

## 2012-12-14 ENCOUNTER — Telehealth: Payer: Self-pay | Admitting: *Deleted

## 2012-12-14 NOTE — Telephone Encounter (Signed)
Spoke with patient.  She stated she started the Xeloda on 12/10/12.  She is tolerating the medication so far okay and denies problems at present, including abd. Pain or nausea.  She will come for lab and appointment as scheduled on 12/16/12.

## 2012-12-15 ENCOUNTER — Telehealth: Payer: Self-pay | Admitting: Oncology

## 2012-12-15 NOTE — Telephone Encounter (Signed)
Sent medical records to Dr. Everardo All. [place] office in [place].

## 2012-12-16 ENCOUNTER — Ambulatory Visit (HOSPITAL_BASED_OUTPATIENT_CLINIC_OR_DEPARTMENT_OTHER): Payer: BC Managed Care – PPO | Admitting: Nurse Practitioner

## 2012-12-16 ENCOUNTER — Telehealth: Payer: Self-pay | Admitting: Oncology

## 2012-12-16 ENCOUNTER — Other Ambulatory Visit (HOSPITAL_BASED_OUTPATIENT_CLINIC_OR_DEPARTMENT_OTHER): Payer: BC Managed Care – PPO | Admitting: Lab

## 2012-12-16 VITALS — BP 113/77 | HR 98 | Temp 97.6°F | Resp 18 | Ht 63.0 in | Wt 104.7 lb

## 2012-12-16 DIAGNOSIS — C20 Malignant neoplasm of rectum: Secondary | ICD-10-CM

## 2012-12-16 DIAGNOSIS — G893 Neoplasm related pain (acute) (chronic): Secondary | ICD-10-CM

## 2012-12-16 DIAGNOSIS — F329 Major depressive disorder, single episode, unspecified: Secondary | ICD-10-CM

## 2012-12-16 LAB — CBC WITH DIFFERENTIAL/PLATELET
Basophils Absolute: 0 10*3/uL (ref 0.0–0.1)
Eosinophils Absolute: 0.1 10*3/uL (ref 0.0–0.5)
LYMPH%: 6.4 % — ABNORMAL LOW (ref 14.0–49.7)
MCH: 30.5 pg (ref 25.1–34.0)
MCV: 91.1 fL (ref 79.5–101.0)
MONO%: 7.8 % (ref 0.0–14.0)
NEUT#: 3.1 10*3/uL (ref 1.5–6.5)
Platelets: 289 10*3/uL (ref 145–400)
RBC: 3.7 10*6/uL (ref 3.70–5.45)

## 2012-12-16 LAB — COMPREHENSIVE METABOLIC PANEL (CC13)
Alkaline Phosphatase: 153 U/L — ABNORMAL HIGH (ref 40–150)
BUN: 16.9 mg/dL (ref 7.0–26.0)
Glucose: 132 mg/dl (ref 70–140)
Sodium: 140 mEq/L (ref 136–145)
Total Bilirubin: 0.45 mg/dL (ref 0.20–1.20)

## 2012-12-16 MED ORDER — LORAZEPAM 0.5 MG PO TABS: 0.5000 mg | ORAL_TABLET | Freq: Three times a day (TID) | ORAL | Status: DC | PRN

## 2012-12-16 NOTE — Telephone Encounter (Signed)
Gave pt appt for lab and MD on November 2014 °

## 2012-12-16 NOTE — Progress Notes (Signed)
OFFICE PROGRESS NOTE  Interval history:  Leah Le returns as scheduled. She began cycle 2 Xeloda on 12/10/2012.   She reports being evaluated in the Emergency Department at [facility] on 12/09/2012 for severe lower abdominal pain. CT scan of the abdomen/pelvis showed postsurgical changes of abdominoperineal resection. The peripherally enhancing fluid collection in the presacral space had decreased in size since 12/02/2012 following placement of a percutaneous drain. The drain appeared in good position. 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 Fecalization of the small bowel suggested delayed transit. There was no evidence of obstruction. There was a similar small volume of ascites and mesenteric edema.  The drain was removed. She was discharged home. She reports the pain resolved later that same day.  She feels she is tolerating the Xeloda well. No significant nausea/vomiting. She has mild intermittent "little bouts" of nausea that resolve with rest. She denies any mouth sores. She has had diarrhea for the past several days. None so far today. Her daughter has also had diarrhea. She denies hand or foot pain or redness. She denies any eye irritation. No skin rash. She intermittently feels "achy" and wonders if this is related to the Xeloda. She has also noted that her ears "feel full" and she is very sensitive to loud noises. She denies any fever. No cough or shortness of breath. She reports a persistent "tiny" wound that is being packed at the perineum. She reports a good appetite.   Objective: Blood pressure 113/77, pulse 98, temperature 97.6 F (36.4 C), temperature source Oral, resp. rate 18, height 5\' 3"  (1.6 m), weight 104 lb 11.2 oz (47.492 kg).  Oropharynx is without thrush or ulceration. Mucous membranes are moist. Lungs are clear. No wheezes or rales. Regular cardiac rhythm. No murmur. Abdomen is soft and nontender. No  hepatomegaly. Left abdomen colostomy. Extremities are without edema. Calves are soft and nontender. Palms are without erythema. No skin rash. Alert and oriented.  Lab Results: Lab Results  Component Value Date   WBC 3.7* 12/16/2012   HGB 11.3* 12/16/2012   HCT 33.7* 12/16/2012   MCV 91.1 12/16/2012   PLT 289 12/16/2012    Chemistry:    Chemistry      Component Value Date/Time   NA 140 12/16/2012 0902   NA 135 07/11/2012 0438   K 4.2 12/16/2012 0902   K 3.9 07/11/2012 0438   CL 106 08/25/2012 1143   CL 105 07/11/2012 0438   CO2 24 12/16/2012 0902   CO2 23 07/11/2012 0438   BUN 16.9 12/16/2012 0902   BUN <3* 07/11/2012 0438   CREATININE 0.8 12/16/2012 0902   CREATININE 0.52 07/11/2012 0438      Component Value Date/Time   CALCIUM 9.6 12/16/2012 0902   CALCIUM 8.5 07/11/2012 0438   ALKPHOS 153* 12/16/2012 0902   ALKPHOS 39 07/05/2012 0409   AST 23 12/16/2012 0902   AST 16 07/05/2012 0409   ALT 16 12/16/2012 0902   ALT 12 07/05/2012 0409   BILITOT 0.45 12/16/2012 0902   BILITOT 0.3 07/05/2012 0409       Studies/Results: No results found.  Medications: I have reviewed the patient's current medications.  Assessment/Plan:  1. Rectal cancer. Low rectal cancer with probable involvement of the anal margin/sphincter musculature status post surgical biopsy 05/13/2012 confirming adenocarcinoma with lymphovascular invasion, uT3uN0. Staging PET scan 06/01/2012 revealed increased FDG activity at the rectum, increased FDG activity of multiple abdominal/pelvic lymph nodes including a retroperitoneal node between  the aorta and IVC and left inguinal nodes. She began radiation and Xeloda on 06/09/2012. Xeloda and radiation were placed on hold during the 07/02/2012 hospitalization. Radiation was resumed. Xeloda was resumed 07/12/2012. Concurrent Xeloda and radiation completed 07/23/2012. Status post an APR on 09/15/2012 with the final pathology confirming a ypT2, N0 tumor, no loss of expression of mismatch repair  proteins, negative surgical margins.  CT chest/abdomen/pelvis on 11/01/2012 showed no evidence of metastatic disease or acute process in the chest. Interval substantial improvement in the previously demonstrated enlarged retroperitoneal pelvic lymph nodes which were hypermetabolic on PET-CT. No pathologically enlarged lymph nodes were identified. Distal colon resection and descending colostomy noted. No evidence of bowel obstruction or recurrent mucosal lesion. A moderate amount of free fluid between the uterus and the sacrum and soft tissue stranding in the posterior pelvic fat. 2. Pain secondary to #1. Improved. She has discontinued OxyContin. 3. Intermittent nausea. Improved. 4. Firm mobile lymph node at the medial left inguinal region. Question malignant. No longer palpable. 5. Hospitalization 07/02/2012 with possible dystonic reaction related to IV Phenergan. Resolved. 6. C. difficile colitis. She completed a course of oral vancomycin. The diarrhea resolved. 7. EGD 07/08/2012 with finding of a large amount of retained gastric contents. Question gastroparesis. She continues Reglan. 8. Anorexia/weight loss/early satiety. Improved, no longer on TNA. Weight is stable. 9. Depression-she declined an antidepressant. 10. Status post right transgluteal drainage catheter placement 12/03/2012 for presacral fluid collection. The drain was removed on 12/09/2012. 11. Onset of severe low abdominal pain 12/09/2012 status post evaluation at [facility]. The drainage catheter was removed. Her pain resolved over the course of the day.  Disposition-Leah Le appears stable. She is completing cycle 2 adjuvant Xeloda. A total of 5 cycles are planned.   Overall she appears to be tolerating the Xeloda well. She will begin cycle 3 on 12/31/2012.   She will return for a followup visit on 01/18/2013. She will contact the office in the interim with any problems. We specifically discussed mouth sores, diarrhea and symptoms  of hand-foot syndrome.  Plan reviewed with Dr. Truett Perna.  [name] ANP/GNP-BC    Total visit time 30 minutes with greater than 50% of that time spent in counseling/coordination of care.

## 2012-12-17 ENCOUNTER — Ambulatory Visit: Payer: BC Managed Care – PPO | Admitting: Nurse Practitioner

## 2012-12-21 ENCOUNTER — Other Ambulatory Visit: Payer: Self-pay | Admitting: *Deleted

## 2012-12-21 DIAGNOSIS — C20 Malignant neoplasm of rectum: Secondary | ICD-10-CM

## 2012-12-21 MED ORDER — CAPECITABINE 500 MG PO TABS: ORAL_TABLET | ORAL | Status: DC

## 2012-12-21 NOTE — Telephone Encounter (Signed)
THIS REFILL REQUEST FOR CAPECITABINE WAS PLACED ON DR.SHERRILL'S DESK. 

## 2012-12-22 NOTE — Telephone Encounter (Signed)
Faxed confirmation from [employer] that referral has been received and in insurance verification process.

## 2012-12-28 NOTE — Telephone Encounter (Signed)
RECEIVED A FAX FROM [employer] CONCERNING A CONFIRMATION OF PRESCRIPTION SHIPMENT FOR CAPECITABINE ON 12/27/12.

## 2012-12-30 ENCOUNTER — Telehealth: Payer: Self-pay | Admitting: *Deleted

## 2012-12-30 NOTE — Telephone Encounter (Signed)
Spoke with patient by phone.  She reports doing very with the Xeloda.  Her main complaint is fatigue.  She states her appetite is much better and that she is eating "a lot".  She denies pain and reports having no problems with ostomy care or output.  She understands to start cycle #3 Xeloda on 12/31/12.  She stated the drug company has already called her regarding shipment of the drug.  She understands to call for problems.  Will continue to follow as needed.

## 2013-01-03 ENCOUNTER — Telehealth: Payer: Self-pay | Admitting: *Deleted

## 2013-01-03 NOTE — Telephone Encounter (Signed)
Received phone call from patient's husband.  He stated that his wife would like to speak with someone and that she is ready to have counseling, emotional, and spiritual support.  This RN offered SW, Counseling Interns, and Chaplain services who focus on the cancer experience.  Patient's husband requested his wife be first contacted by the Chaplain.  This RN spoke with Lenell Antu re: above.  She will contact the patient.

## 2013-01-10 ENCOUNTER — Other Ambulatory Visit (HOSPITAL_COMMUNITY): Payer: Self-pay | Admitting: Oncology

## 2013-01-13 ENCOUNTER — Ambulatory Visit
Admission: RE | Admit: 2013-01-13 | Payer: BC Managed Care – PPO | Source: Ambulatory Visit | Admitting: Radiation Oncology

## 2013-01-18 ENCOUNTER — Other Ambulatory Visit: Payer: Self-pay | Admitting: *Deleted

## 2013-01-18 ENCOUNTER — Other Ambulatory Visit (HOSPITAL_BASED_OUTPATIENT_CLINIC_OR_DEPARTMENT_OTHER): Payer: BC Managed Care – PPO | Admitting: Lab

## 2013-01-18 ENCOUNTER — Telehealth: Payer: Self-pay | Admitting: Oncology

## 2013-01-18 ENCOUNTER — Ambulatory Visit (HOSPITAL_BASED_OUTPATIENT_CLINIC_OR_DEPARTMENT_OTHER): Payer: BC Managed Care – PPO | Admitting: Oncology

## 2013-01-18 VITALS — BP 110/66 | HR 71 | Temp 97.9°F | Resp 18 | Ht 63.0 in | Wt 108.0 lb

## 2013-01-18 DIAGNOSIS — C20 Malignant neoplasm of rectum: Secondary | ICD-10-CM

## 2013-01-18 DIAGNOSIS — G893 Neoplasm related pain (acute) (chronic): Secondary | ICD-10-CM

## 2013-01-18 LAB — CBC WITH DIFFERENTIAL/PLATELET
Basophils Absolute: 0 10*3/uL (ref 0.0–0.1)
Eosinophils Absolute: 0.1 10*3/uL (ref 0.0–0.5)
HCT: 31.5 % — ABNORMAL LOW (ref 34.8–46.6)
HGB: 10.6 g/dL — ABNORMAL LOW (ref 11.6–15.9)
LYMPH%: 8.4 % — ABNORMAL LOW (ref 14.0–49.7)
MCV: 96.2 fL (ref 79.5–101.0)
MONO%: 11.4 % (ref 0.0–14.0)
NEUT#: 3 10*3/uL (ref 1.5–6.5)
Platelets: 264 10*3/uL (ref 145–400)

## 2013-01-18 LAB — COMPREHENSIVE METABOLIC PANEL (CC13)
Albumin: 3.4 g/dL — ABNORMAL LOW (ref 3.5–5.0)
Alkaline Phosphatase: 101 U/L (ref 40–150)
BUN: 20.1 mg/dL (ref 7.0–26.0)
Glucose: 87 mg/dl (ref 70–140)
Potassium: 4 mEq/L (ref 3.5–5.1)
Total Bilirubin: 0.41 mg/dL (ref 0.20–1.20)

## 2013-01-18 MED ORDER — TEMAZEPAM 15 MG PO CAPS: 15.0000 mg | ORAL_CAPSULE | Freq: Every evening | ORAL | Status: DC | PRN

## 2013-01-18 NOTE — Progress Notes (Signed)
[facility]    OFFICE PROGRESS NOTE   INTERVAL HISTORY:   She returns for scheduled followup of rectal cancer. She completed another cycle of Xeloda beginning on 12/31/2012. No mouth sores or hand/foot pain. She had one day of "loose "stool where she has to empty the colostomy bag 2 or 3 times. The stool is not watery. No other diarrhea. The perineal wound has healed.  She has noted urinary incontinence following the rectal surgery. Her chief complaint is malaise.  Objective:  Vital signs in last 24 hours:  Blood pressure 110/66, pulse 71, temperature 97.9 F (36.6 C), temperature source Oral, resp. rate 18, height 5\' 3"  (1.6 m), weight 108 lb (48.988 kg).    HEENT: No thrush or ulcers Lymphatics: ? 1/2 cm firm left inguinal node. No other palpable inguinal nodes. Resp: End inspiratory wheeze at the right base, no respiratory distress Cardio: Regular rate and rhythm GI: No hepatomegaly, left lower quadrant colostomy Vascular: No leg edema  Skin: Palms and soles without erythema or skin breakdown   Lab Results:  Lab Results  Component Value Date   WBC 3.8* 01/18/2013   HGB 10.6* 01/18/2013   HCT 31.5* 01/18/2013   MCV 96.2 01/18/2013   PLT 264 01/18/2013   ANC 3.0    Medications: I have reviewed the patient's current medications.  Assessment/Plan: 1. Rectal cancer. Low rectal cancer with probable involvement of the anal margin/sphincter musculature status post surgical biopsy 05/13/2012 confirming adenocarcinoma with lymphovascular invasion, uT3uN0. Staging PET scan 06/01/2012 revealed increased FDG activity at the rectum, increased FDG activity of multiple abdominal/pelvic lymph nodes including a retroperitoneal node between the aorta and IVC and left inguinal nodes. She began radiation and Xeloda on 06/09/2012. Xeloda and radiation were placed on hold during the 07/02/2012 hospitalization. Radiation was resumed. Xeloda was resumed 07/12/2012. Concurrent  Xeloda and radiation completed 07/23/2012. Status post an APR on 09/15/2012 with the final pathology confirming a ypT2, N0 tumor, no loss of expression of mismatch repair proteins, negative surgical margins.  CT chest/abdomen/pelvis on 11/01/2012 showed no evidence of metastatic disease or acute process in the chest. Interval substantial improvement in the previously demonstrated enlarged retroperitoneal pelvic lymph nodes which were hypermetabolic on PET-CT. No pathologically enlarged lymph nodes were identified. Distal colon resection and descending colostomy noted. No evidence of bowel obstruction or recurrent mucosal lesion. A moderate amount of free fluid between the uterus and the sacrum and soft tissue stranding in the posterior pelvic fat. Initiation of adjuvant Xeloda 11/19/2012 2. Pain secondary to #1. Improved. She has discontinued OxyContin. 3. Intermittent nausea. Improved. 4. Firm mobile lymph node at the medial left inguinal region. Question malignant. ? Less than 1 cm left inguinal node on exam 01/18/2013 5. Hospitalization 07/02/2012 with possible dystonic reaction related to IV Phenergan. Resolved. 6. C. difficile colitis. She completed a course of oral vancomycin. The diarrhea resolved. 7. EGD 07/08/2012 with finding of a large amount of retained gastric contents. Question gastroparesis. She continues Reglan. 8. Anorexia/weight loss/early satiety. Improved. 9. Depression 10. Status post right transgluteal drainage catheter placement 12/03/2012 for presacral fluid collection. The drain was removed on 12/09/2012. 11. Onset of severe low abdominal pain 12/09/2012 status post evaluation at [facility]. The drainage catheter was removed. Her pain resolved over the course of the day. 12. Urinary incontinence-likely related to radiation and pelvic surgery, we will make a physical therapy referral   Disposition:  She has completed 3 cycles of adjuvant Xeloda. She will begin cycle 4  on  01/21/2013. Ms. Pongratz appears to be tolerating the Xeloda well. She will return for an office visit in 3 weeks.  We will make a referral to the outpatient physical therapy department to consider pelvic exercises for help with the urinary incontinence.   Thornton Papas, MD  01/18/2013  4:22 PM

## 2013-01-18 NOTE — Telephone Encounter (Signed)
gv and printed appt sched and avs for pt for Nov....LT not here 11.26.14 sched for 11.24.14

## 2013-01-19 ENCOUNTER — Telehealth: Payer: Self-pay | Admitting: *Deleted

## 2013-01-19 NOTE — Telephone Encounter (Signed)
Xeloda shipped on 01/18/13 for next day delivery.

## 2013-01-20 ENCOUNTER — Other Ambulatory Visit: Payer: Self-pay

## 2013-01-31 ENCOUNTER — Other Ambulatory Visit: Payer: Self-pay | Admitting: *Deleted

## 2013-01-31 NOTE — Telephone Encounter (Signed)
THIS REFILL REQUEST FOR CAPECITABINE WAS GIVEN TO DR.SHERRILL'S NURSE, TANYA WHITLOCK,RN. 

## 2013-02-02 ENCOUNTER — Ambulatory Visit: Payer: BC Managed Care – PPO | Attending: Oncology | Admitting: Physical Therapy

## 2013-02-02 DIAGNOSIS — IMO0001 Reserved for inherently not codable concepts without codable children: Secondary | ICD-10-CM | POA: Insufficient documentation

## 2013-02-02 DIAGNOSIS — M2420 Disorder of ligament, unspecified site: Secondary | ICD-10-CM | POA: Insufficient documentation

## 2013-02-02 DIAGNOSIS — M629 Disorder of muscle, unspecified: Secondary | ICD-10-CM | POA: Insufficient documentation

## 2013-02-03 ENCOUNTER — Other Ambulatory Visit: Payer: Self-pay | Admitting: *Deleted

## 2013-02-03 DIAGNOSIS — C20 Malignant neoplasm of rectum: Secondary | ICD-10-CM

## 2013-02-03 MED ORDER — CAPECITABINE 500 MG PO TABS: ORAL_TABLET | ORAL | Status: DC

## 2013-02-07 ENCOUNTER — Telehealth: Payer: Self-pay | Admitting: Oncology

## 2013-02-07 ENCOUNTER — Ambulatory Visit (HOSPITAL_BASED_OUTPATIENT_CLINIC_OR_DEPARTMENT_OTHER): Payer: BC Managed Care – PPO | Admitting: Nurse Practitioner

## 2013-02-07 ENCOUNTER — Other Ambulatory Visit (HOSPITAL_BASED_OUTPATIENT_CLINIC_OR_DEPARTMENT_OTHER): Payer: BC Managed Care – PPO | Admitting: Lab

## 2013-02-07 VITALS — BP 102/63 | HR 65 | Temp 97.9°F | Resp 20 | Ht 63.0 in | Wt 108.6 lb

## 2013-02-07 DIAGNOSIS — C20 Malignant neoplasm of rectum: Secondary | ICD-10-CM

## 2013-02-07 DIAGNOSIS — R32 Unspecified urinary incontinence: Secondary | ICD-10-CM

## 2013-02-07 DIAGNOSIS — F329 Major depressive disorder, single episode, unspecified: Secondary | ICD-10-CM

## 2013-02-07 LAB — CBC WITH DIFFERENTIAL/PLATELET
BASO%: 0.6 % (ref 0.0–2.0)
Basophils Absolute: 0 10*3/uL (ref 0.0–0.1)
EOS%: 3.7 % (ref 0.0–7.0)
MCH: 33.3 pg (ref 25.1–34.0)
MCHC: 33.9 g/dL (ref 31.5–36.0)
MCV: 98.2 fL (ref 79.5–101.0)
MONO%: 11.4 % (ref 0.0–14.0)
NEUT#: 2.7 10*3/uL (ref 1.5–6.5)
NEUT%: 75.5 % (ref 38.4–76.8)
Platelets: 259 10*3/uL (ref 145–400)
RDW: 23.9 % — ABNORMAL HIGH (ref 11.2–14.5)
WBC: 3.6 10*3/uL — ABNORMAL LOW (ref 3.9–10.3)

## 2013-02-07 LAB — CEA: CEA: <0.5 ng/mL (ref 0.0–5.0)

## 2013-02-07 NOTE — Telephone Encounter (Signed)
gv pt appt schedule for december.  °

## 2013-02-07 NOTE — Progress Notes (Signed)
OFFICE PROGRESS NOTE  Interval history:  Leah Le returns for followup of rectal cancer. She completed cycle 4 Xeloda beginning 01/21/2013. She denies nausea/vomiting. No mouth sores. No diarrhea. She denies hand or foot pain or redness.  Objective: Blood pressure 102/63, pulse 65, temperature 97.9 F (36.6 C), temperature source Oral, resp. rate 20, height 5\' 3"  (1.6 m), weight 108 lb 9.6 oz (49.261 kg).  Oropharynx is without thrush or ulceration. Lungs are clear. Regular cardiac rhythm. Abdomen soft and nontender. No hepatomegaly. Left lower quadrant colostomy. No leg edema. Palms without erythema or skin breakdown.  Lab Results: Lab Results  Component Value Date   WBC 3.6* 02/07/2013   HGB 10.3* 02/07/2013   HCT 30.4* 02/07/2013   MCV 98.2 02/07/2013   PLT 259 02/07/2013    Chemistry:    Chemistry      Component Value Date/Time   NA 140 01/18/2013 1503   NA 135 07/11/2012 0438   K 4.0 01/18/2013 1503   K 3.9 07/11/2012 0438   CL 106 08/25/2012 1143   CL 105 07/11/2012 0438   CO2 25 01/18/2013 1503   CO2 23 07/11/2012 0438   BUN 20.1 01/18/2013 1503   BUN <3* 07/11/2012 0438   CREATININE 0.8 01/18/2013 1503   CREATININE 0.52 07/11/2012 0438      Component Value Date/Time   CALCIUM 9.1 01/18/2013 1503   CALCIUM 8.5 07/11/2012 0438   ALKPHOS 101 01/18/2013 1503   ALKPHOS 39 07/05/2012 0409   AST 21 01/18/2013 1503   AST 16 07/05/2012 0409   ALT 10 01/18/2013 1503   ALT 12 07/05/2012 0409   BILITOT 0.41 01/18/2013 1503   BILITOT 0.3 07/05/2012 0409     CEA pending.  Studies/Results: No results found.  Medications: I have reviewed the patient's current medications.  Assessment/Plan:  1. Rectal cancer. Low rectal cancer with probable involvement of the anal margin/sphincter musculature status post surgical biopsy 05/13/2012 confirming adenocarcinoma with lymphovascular invasion, uT3uN0. Staging PET scan 06/01/2012 revealed increased FDG activity at the rectum, increased FDG  activity of multiple abdominal/pelvic lymph nodes including a retroperitoneal node between the aorta and IVC and left inguinal nodes. She began radiation and Xeloda on 06/09/2012. Xeloda and radiation were placed on hold during the 07/02/2012 hospitalization. Radiation was resumed. Xeloda was resumed 07/12/2012. Concurrent Xeloda and radiation completed 07/23/2012. Status post an APR on 09/15/2012 with the final pathology confirming a ypT2, N0 tumor, no loss of expression of mismatch repair proteins, negative surgical margins.  CT chest/abdomen/pelvis on 11/01/2012 showed no evidence of metastatic disease or acute process in the chest. Interval substantial improvement in the previously demonstrated enlarged retroperitoneal pelvic lymph nodes which were hypermetabolic on PET-CT. No pathologically enlarged lymph nodes were identified. Distal colon resection and descending colostomy noted. No evidence of bowel obstruction or recurrent mucosal lesion. A moderate amount of free fluid between the uterus and the sacrum and soft tissue stranding in the posterior pelvic fat.  Initiation of adjuvant Xeloda 11/19/2012 2. Pain secondary to #1. Improved. She has discontinued OxyContin. 3. Intermittent nausea. Improved. 4. Firm mobile lymph node at the medial left inguinal region. Question malignant. ? Less than 1 cm left inguinal node on exam 01/18/2013 5. Hospitalization 07/02/2012 with possible dystonic reaction related to IV Phenergan. Resolved. 6. C. difficile colitis. She completed a course of oral vancomycin. The diarrhea resolved. 7. EGD 07/08/2012 with finding of a large amount of retained gastric contents. Question gastroparesis. She continues Reglan. 8. Anorexia/weight loss/early satiety. Improved. 9.  Depression 10. Status post right transgluteal drainage catheter placement 12/03/2012 for presacral fluid collection. The drain was removed on 12/09/2012. 11. Onset of severe low abdominal pain 12/09/2012  status post evaluation at [facility]. The drainage catheter was removed. Her pain resolved over the course of the day. 12. Urinary incontinence-likely related to radiation and pelvic surgery. She was referred to physical therapy.  Disposition-she appears stable. She has completed 4 cycles of adjuvant Xeloda. Plan to proceed with the fifth and final cycle beginning 02/11/2013. She will return for a followup visit in one month. She will contact the office in the interim with any problems.  Plan reviewed with Dr. Truett Perna.  [name] ANP/GNP-BC

## 2013-02-07 NOTE — Telephone Encounter (Signed)
RECEIVED A FAX FROM [employer] CONCERNING A CONFIRMATION OF PRESCRIPTION SHIPMENT FOR CAPECITABINE ON 02/04/13.

## 2013-02-08 ENCOUNTER — Telehealth: Payer: Self-pay | Admitting: *Deleted

## 2013-02-08 NOTE — Telephone Encounter (Signed)
VM requesting results. Given permission to leave on her VM if she does not answer. Called back with normal results.

## 2013-02-12 ENCOUNTER — Encounter: Payer: Self-pay | Admitting: Oncology

## 2013-02-25 ENCOUNTER — Telehealth: Payer: Self-pay | Admitting: *Deleted

## 2013-02-25 NOTE — Telephone Encounter (Signed)
Call from [employer] to confirm pt has completed Xeloda treatment. Yes, discontinue Xeloda.

## 2013-03-07 ENCOUNTER — Ambulatory Visit (HOSPITAL_BASED_OUTPATIENT_CLINIC_OR_DEPARTMENT_OTHER): Payer: BC Managed Care – PPO | Admitting: Oncology

## 2013-03-07 ENCOUNTER — Other Ambulatory Visit: Payer: Self-pay | Admitting: *Deleted

## 2013-03-07 ENCOUNTER — Other Ambulatory Visit (HOSPITAL_BASED_OUTPATIENT_CLINIC_OR_DEPARTMENT_OTHER): Payer: BC Managed Care – PPO

## 2013-03-07 VITALS — BP 106/69 | HR 60 | Temp 97.0°F | Resp 20 | Ht 63.0 in | Wt 111.5 lb

## 2013-03-07 DIAGNOSIS — R32 Unspecified urinary incontinence: Secondary | ICD-10-CM

## 2013-03-07 DIAGNOSIS — R110 Nausea: Secondary | ICD-10-CM

## 2013-03-07 DIAGNOSIS — C20 Malignant neoplasm of rectum: Secondary | ICD-10-CM

## 2013-03-07 DIAGNOSIS — F329 Major depressive disorder, single episode, unspecified: Secondary | ICD-10-CM

## 2013-03-07 DIAGNOSIS — G893 Neoplasm related pain (acute) (chronic): Secondary | ICD-10-CM

## 2013-03-07 LAB — CBC WITH DIFFERENTIAL/PLATELET
BASO%: 0.6 % (ref 0.0–2.0)
Basophils Absolute: 0 10*3/uL (ref 0.0–0.1)
EOS%: 2.5 % (ref 0.0–7.0)
Eosinophils Absolute: 0.1 10*3/uL (ref 0.0–0.5)
LYMPH%: 7.3 % — ABNORMAL LOW (ref 14.0–49.7)
MCH: 34.7 pg — ABNORMAL HIGH (ref 25.1–34.0)
MCV: 101.8 fL — ABNORMAL HIGH (ref 79.5–101.0)
MONO%: 9.6 % (ref 0.0–14.0)
NEUT#: 2.7 10*3/uL (ref 1.5–6.5)
Platelets: 209 10*3/uL (ref 145–400)
RBC: 3.16 10*6/uL — ABNORMAL LOW (ref 3.70–5.45)

## 2013-03-07 NOTE — Progress Notes (Signed)
[facility]    OFFICE PROGRESS NOTE  INTERVAL HISTORY:   She returns for scheduled followup of rectal cancer. She completed a final cycle of adjuvant Xeloda beginning on 02/11/2013. She noted dryness of the hands at the end of the Xeloda treatment. No mouth sores. No problem with the colostomy. The perineal wound has healed. Good energy level.  She plans to followup with Dr. Byrd Hesselbach for a restaging CT evaluation in February.  Objective:  Vital signs in last 24 hours:  Blood pressure 106/69, pulse 60, temperature 97 F (36.1 C), temperature source Oral, resp. rate 20, height 5\' 3"  (1.6 m), weight 111 lb 8 oz (50.576 kg).    HEENT: No thrush or ulcers Lymphatics: No cervical, supraclavicular, axillary, or inguinal nodes Resp: Lungs clear bilaterally Cardio: Regular rate and rhythm GI: No hepatomegaly, left lower quadrant colostomy, no mass, the perineal wound has healed-no evidence of recurrent tumor Vascular: No leg edema  Skin: Mild hyperpigmentation over the hands, no skin breakdown or erythema     Lab Results:  Lab Results  Component Value Date   WBC 3.4* 03/07/2013   HGB 11.0* 03/07/2013   HCT 32.1* 03/07/2013   MCV 101.8* 03/07/2013   PLT 209 03/07/2013   ANC 2.7  CEA less than 0.5 on 02/07/2013   Medications: I have reviewed the patient's current medications.  Assessment/Plan: 1. Rectal cancer. Low rectal cancer with probable involvement of the anal margin/sphincter musculature status post surgical biopsy 05/13/2012 confirming adenocarcinoma with lymphovascular invasion, uT3uN0. Staging PET scan 06/01/2012 revealed increased FDG activity at the rectum, increased FDG activity of multiple abdominal/pelvic lymph nodes including a retroperitoneal node between the aorta and IVC and left inguinal nodes. She began radiation and Xeloda on 06/09/2012. Xeloda and radiation were placed on hold during the 07/02/2012 hospitalization. Radiation was resumed.  Xeloda was resumed 07/12/2012. Concurrent Xeloda and radiation completed 07/23/2012. Status post an APR on 09/15/2012 with the final pathology confirming a ypT2, N0 tumor, no loss of expression of mismatch repair proteins, negative surgical margins.  CT chest/abdomen/pelvis on 11/01/2012 showed no evidence of metastatic disease or acute process in the chest. Interval substantial improvement in the previously demonstrated enlarged retroperitoneal pelvic lymph nodes which were hypermetabolic on PET-CT. No pathologically enlarged lymph nodes were identified. Distal colon resection and descending colostomy noted. No evidence of bowel obstruction or recurrent mucosal lesion. A moderate amount of free fluid between the uterus and the sacrum and soft tissue stranding in the posterior pelvic fat.  Initiation of adjuvant Xeloda 11/19/2012, cycle 5 completed beginning on 02/11/2013 2. Pain secondary to #1. Improved. She has discontinued OxyContin. 3. Intermittent nausea. Improved. She is weaning the metoclopramide to off. 4. Firm mobile lymph node at the medial left inguinal region. Question malignant. No inguinal nodes on exam today.  5. Hospitalization 07/02/2012 with possible dystonic reaction related to IV Phenergan. Resolved. 6. C. difficile colitis. She completed a course of oral vancomycin. The diarrhea resolved. 7. EGD 07/08/2012 with finding of a large amount of retained gastric contents. Question gastroparesis. She is weaning the metoclopramide  8. Anorexia/weight loss/early satiety. Improved. 9. Depression 10. Status post right transgluteal drainage catheter placement 12/03/2012 for presacral fluid collection. The drain was removed on 12/09/2012. 11. Onset of severe low abdominal pain 12/09/2012 status post evaluation at [facility]. The drainage catheter was removed. Her pain resolved over the course of the day. 12. Urinary incontinence-likely related to radiation and pelvic surgery. She was referred to  physical therapy.  Disposition:    She appears well. She has completed adjuvant therapy. She will see Dr. Byrd Hesselbach for a restaging CT evaluation in February. Ms. Valenza will return for an office visit and CEA in May. She is in clinical remission from rectal cancer. She will return to work 03/28/2013. I recommended she have an influenza vaccine.  Thornton Papas, MD  03/07/2013  10:22 AM

## 2013-03-07 NOTE — Progress Notes (Signed)
Met with patient.  Discussed several survivorship items such as support groups, PT rehab. program, returning back to work, etc.  Patient stated she had been to the PT and that it had been helpful.  Patient expressed interest in the support groups and appreciated the information given on them.  Patient plans to return to work on 03/28/13.  A return to work letter was signed by Dr. Truett Perna.  This RN faxed the note to [employer] using the fax# provided for by the patient.  Patient denies barriers to care and was appreciative of the services provided.

## 2013-03-08 ENCOUNTER — Other Ambulatory Visit: Payer: Self-pay | Admitting: *Deleted

## 2013-03-08 MED ORDER — TEMAZEPAM 7.5 MG PO CAPS: 7.5000 mg | ORAL_CAPSULE | Freq: Every evening | ORAL | Status: DC | PRN

## 2013-03-25 ENCOUNTER — Encounter: Payer: Self-pay | Admitting: *Deleted

## 2013-04-07 ENCOUNTER — Other Ambulatory Visit (HOSPITAL_COMMUNITY): Payer: Self-pay | Admitting: Oncology

## 2013-04-07 DIAGNOSIS — C20 Malignant neoplasm of rectum: Secondary | ICD-10-CM

## 2013-05-09 ENCOUNTER — Other Ambulatory Visit: Payer: Self-pay | Admitting: *Deleted

## 2013-05-09 MED ORDER — TEMAZEPAM 15 MG PO CAPS: 15.0000 mg | ORAL_CAPSULE | Freq: Every evening | ORAL | Status: DC | PRN

## 2013-05-09 NOTE — Telephone Encounter (Signed)
Message from pt requesting to increase Temazepam back to 15 mg. She wakes up in the middle of the night and has trouble falling back to sleep. Reviewed with Dr. Benay Spice, order received.

## 2013-06-10 ENCOUNTER — Other Ambulatory Visit: Payer: Self-pay | Admitting: Oncology

## 2013-07-12 IMAGING — RF DG UGI W/ SMALL BOWEL
14 of 24 series · 14 of 24 positions shown · non-contrast
Comparison: 06/01/2012

CLINICAL DATA: Nausea.  Rectal cancer, undergoing radiation therapy.
Suspected gastroparesis.  Retained gastric contents on endoscopy.
Recent C difficile infection.

[Series 1: run · 1 of 1 slices shown (1 of 14)]
[im 1/1]
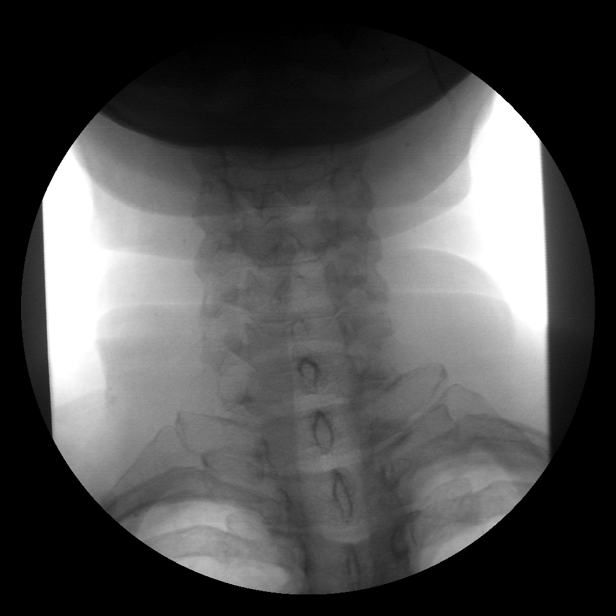

[Series 3: run · 1 of 1 slices shown (2 of 14)]
[im 1/1]
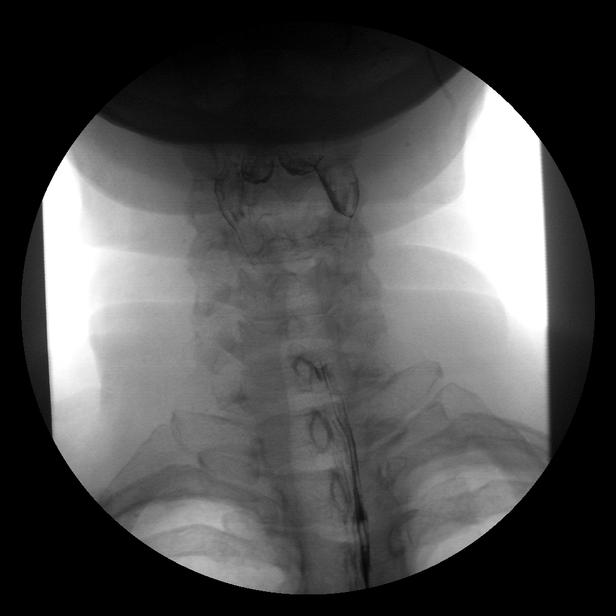

[Series 5: run · 1 of 1 slices shown (3 of 14)]
[im 1/1]
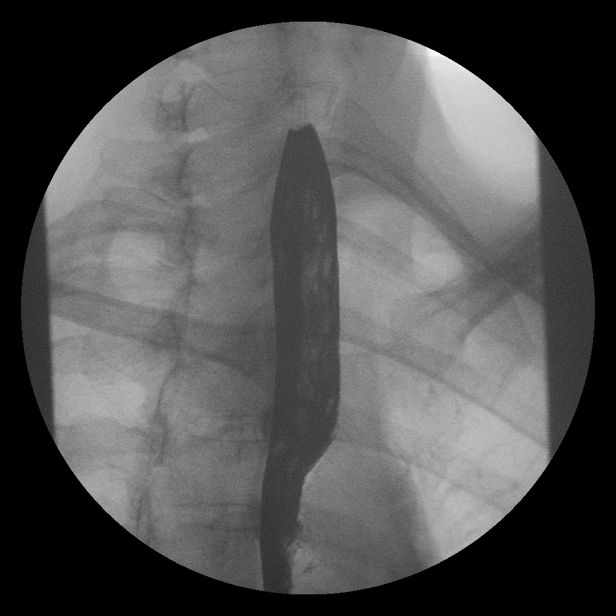

[Series 7: run · 1 of 1 slices shown (4 of 14)]
[im 1/1]
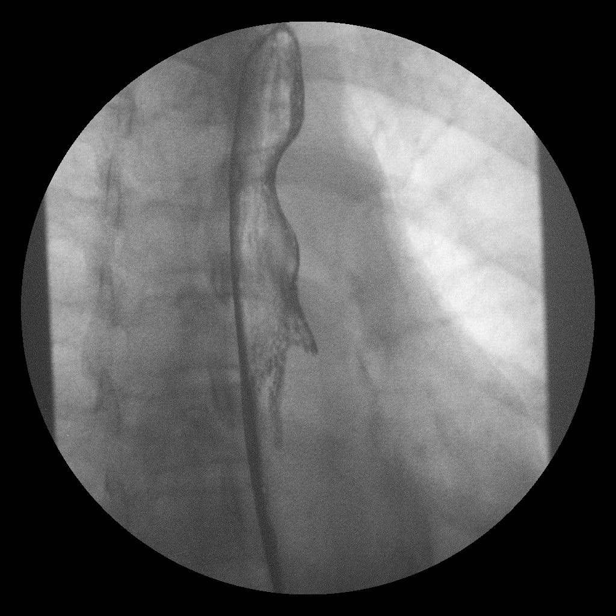

[Series 8: run · 1 of 1 slices shown (5 of 14)]
[im 1/1]
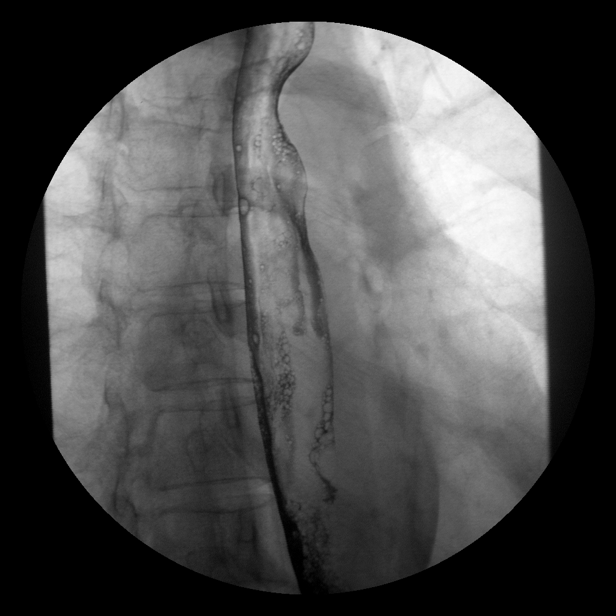

[Series 10: run · 1 of 1 slices shown (6 of 14)]
[im 1/1]
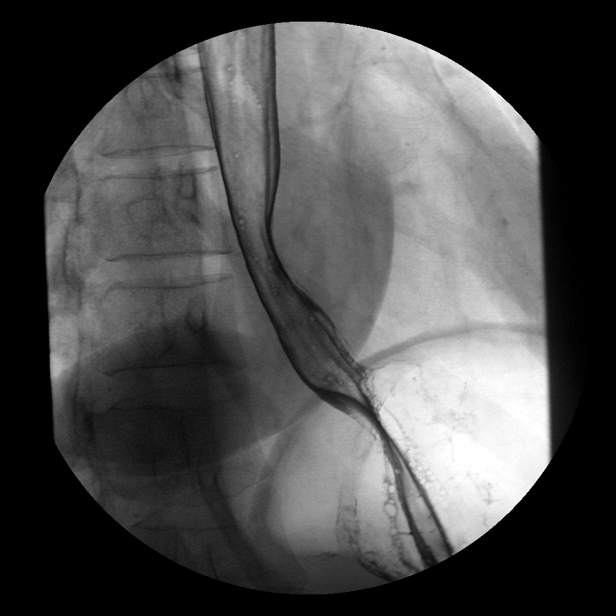

[Series 12: run · 1 of 1 slices shown (7 of 14)]
[im 1/1]
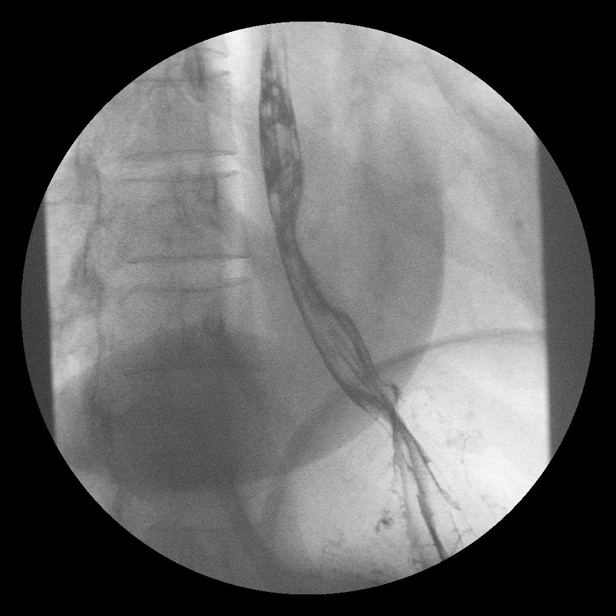

[Series 13: run · 1 of 1 slices shown (8 of 14)]
[im 1/1]
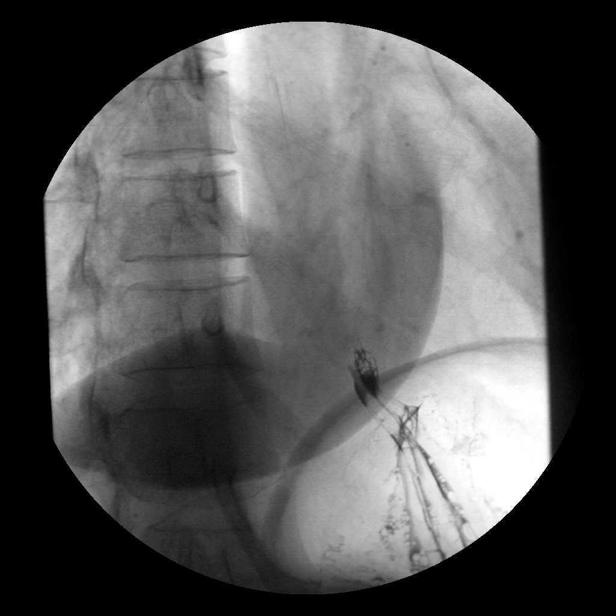

[Series 15: run · 1 of 2 slices shown (9 of 14)]
[im 1/2]
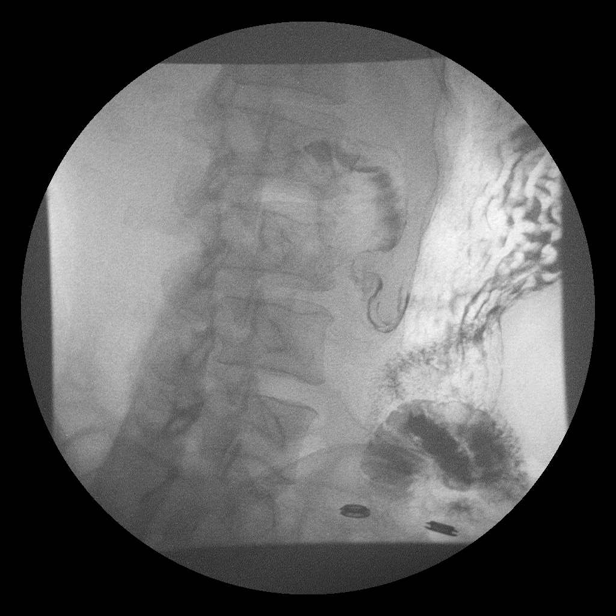

[Series 17: run · 1 of 1 slices shown (10 of 14)]
[im 1/1]
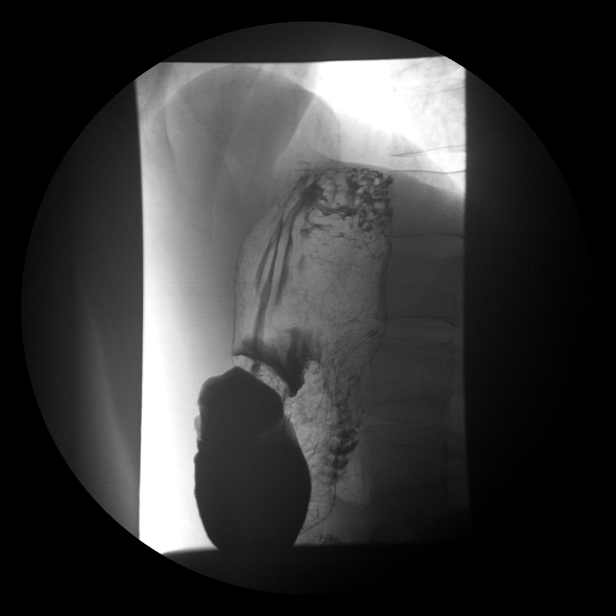

[Series 19: run · 1 of 1 slices shown (11 of 14)]
[im 1/1]
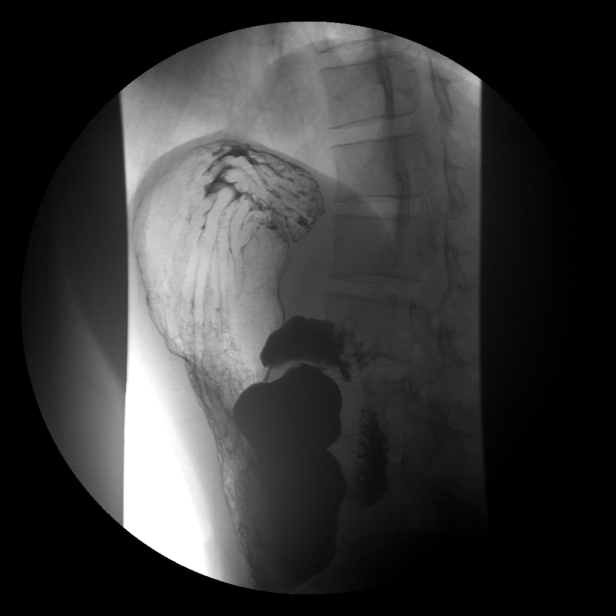

[Series 20: run · 1 of 1 slices shown (12 of 14)]
[im 1/1]
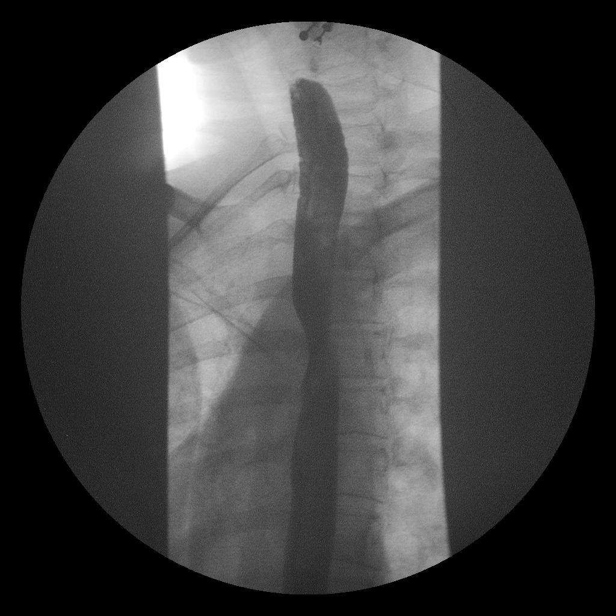

[Series 22: run · 1 of 1 slices shown (13 of 14)]
[im 1/1]
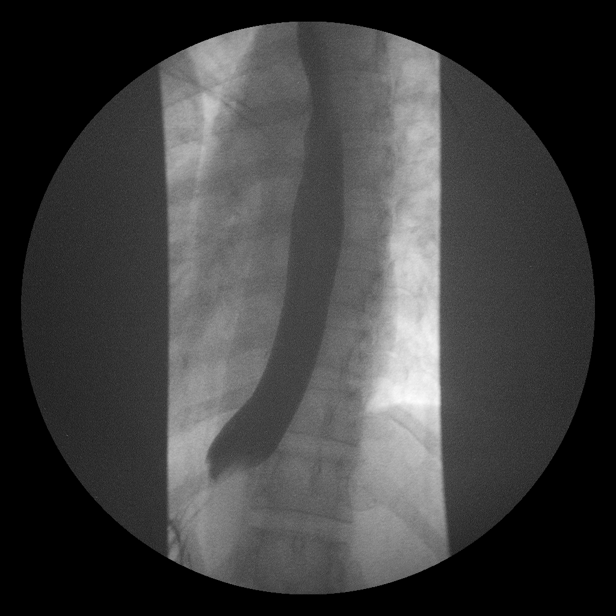

[Series 24: run · 1 of 1 slices shown (14 of 14)]
[im 1/1]
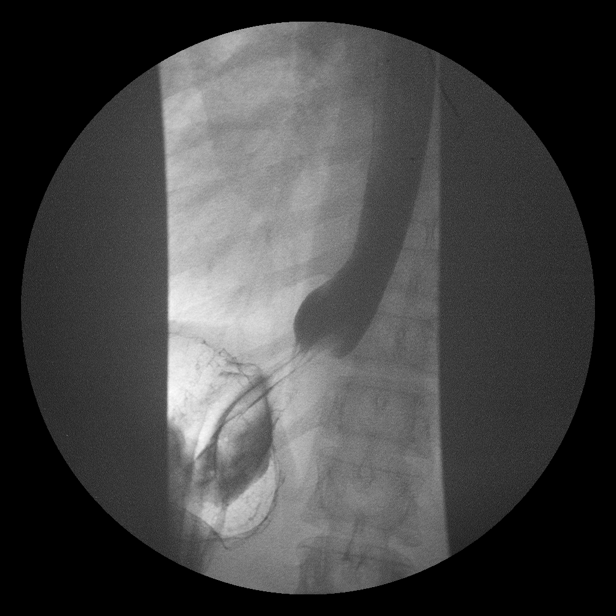

[14 of 24 positions shown; findings below may reference images not displayed]

UPPER GI W/ SMALL BOWEL

Prior to today's exam, I called Dr. Nayelli in radiation oncology by
telephone to discuss whether the barium would interfere with the
patients radiation therapy session planned for today.  He indicated
that it should not be a problem.

Fluoroscopy Time: 7 minutes, 48 seconds
FINDINGS: The pharyngeal phase of swallowing appears normal.
Primary peristaltic waves were preserved on all swallows.  No
esophageal stricture, ulceration, or other significant esophageal
abnormality is observed.

A slightly flattened appearance of the stomach antrum is attributed
to the patient's slim body habitus.  Correlating with prior CT
examinations, this portion of the stomach is interposed between the
pancreas and abdominal wall without a great deal of space or
expansion.

We do not demonstrate significant delay in contrast flow into the
duodenum.  No gastric ulceration is observed.

Duodenum appears unremarkable.  No significant ulceration or
definite fold thickening.

On the small bowel portion of the exam, jejunal folds appear
normal.  Small bowel caliber unremarkable.  Terminal ileum
unremarkable on the dedicated spot images.  The appendix was noted
to fill with contrast medium.
IMPRESSION: 1.  No specific abnormality of the esophagus, stomach, or small
bowel is identified.  There is some extrinsic narrowing of the
stomach in the antrum region attributed to the patient's thin body
habitus causing some extrinsic narrowing of the region; a specific
abnormality in this vicinity was not observed on the recent
endoscopy.

## 2013-07-28 ENCOUNTER — Telehealth: Payer: Self-pay | Admitting: Oncology

## 2013-07-28 NOTE — Telephone Encounter (Signed)
Gave pt appt for lab and MD for May 2015 °

## 2013-08-04 ENCOUNTER — Telehealth: Payer: Self-pay | Admitting: Oncology

## 2013-08-04 ENCOUNTER — Other Ambulatory Visit (HOSPITAL_BASED_OUTPATIENT_CLINIC_OR_DEPARTMENT_OTHER): Payer: BC Managed Care – PPO

## 2013-08-04 ENCOUNTER — Ambulatory Visit (HOSPITAL_BASED_OUTPATIENT_CLINIC_OR_DEPARTMENT_OTHER): Payer: BC Managed Care – PPO | Admitting: Nurse Practitioner

## 2013-08-04 VITALS — BP 114/57 | HR 69 | Temp 98.3°F | Resp 18 | Ht 63.0 in | Wt 112.1 lb

## 2013-08-04 DIAGNOSIS — C20 Malignant neoplasm of rectum: Secondary | ICD-10-CM

## 2013-08-04 DIAGNOSIS — R32 Unspecified urinary incontinence: Secondary | ICD-10-CM

## 2013-08-04 NOTE — Telephone Encounter (Signed)
gv and printed apt sched adn avs for pt for NOV

## 2013-08-04 NOTE — Progress Notes (Signed)
[facility] OFFICE PROGRESS NOTE   Diagnosis:  Rectal cancer.  INTERVAL HISTORY:   Leah Le returns as scheduled. She feels well. No problems with the colostomy. She denies pain. No shortness of breath. No chest pain. No urinary symptoms. She has a good appetite and good energy level.  Objective:  Vital signs in last 24 hours:  Blood pressure 114/57, pulse 69, temperature 98.3 F (36.8 C), temperature source Oral, resp. rate 18, height 5' 3"  (1.6 m), weight 112 lb 1.6 oz (50.848 kg), SpO2 100.00%.    HEENT: No thrush or ulcerations. Lymphatics: No palpable cervical, supraclavicular, axillary or inguinal lymph nodes. Resp: Lungs clear. Cardio: Regular cardiac rhythm. GI: Abdomen soft and nontender. No hepatomegaly. Left lower quadrant colostomy. Perineal wound well healed. No evidence of recurrent tumor. Vascular: No leg edema.    Lab Results:  Lab Results  Component Value Date   WBC 3.4* 03/07/2013   HGB 11.0* 03/07/2013   HCT 32.1* 03/07/2013   MCV 101.8* 03/07/2013   PLT 209 03/07/2013   NEUTROABS 2.7 03/07/2013    Imaging:  No results found.  Medications: I have reviewed the patient's current medications.  Assessment/Plan: 1. Rectal cancer. Low rectal cancer with probable involvement of the anal margin/sphincter musculature status post surgical biopsy 05/13/2012 confirming adenocarcinoma with lymphovascular invasion, uT3uN0. Staging PET scan 06/01/2012 revealed increased FDG activity at the rectum, increased FDG activity of multiple abdominal/pelvic lymph nodes including a retroperitoneal node between the aorta and IVC and left inguinal nodes. She began radiation and Xeloda on 06/09/2012. Xeloda and radiation were placed on hold during the 07/02/2012 hospitalization. Radiation was resumed. Xeloda was resumed 07/12/2012. Concurrent Xeloda and radiation completed 07/23/2012. Status post an APR on 09/15/2012 with the final pathology confirming a ypT2,  N0 tumor, no loss of expression of mismatch repair proteins, negative surgical margins.  CT chest/abdomen/pelvis on 11/01/2012 showed no evidence of metastatic disease or acute process in the chest. Interval substantial improvement in the previously demonstrated enlarged retroperitoneal pelvic lymph nodes which were hypermetabolic on PET-CT. No pathologically enlarged lymph nodes were identified. Distal colon resection and descending colostomy noted. No evidence of bowel obstruction or recurrent mucosal lesion. A moderate amount of free fluid between the uterus and the sacrum and soft tissue stranding in the posterior pelvic fat.  Initiation of adjuvant Xeloda 11/19/2012, cycle 5 completed beginning on 02/11/2013. CT scan 04/28/2013 done at [facility]. No metastatic disease within the chest, abdomen or pelvis. Marked interval reduction of presacral fluid collection. Persistent presacral soft tissue stranding and thickening likely related to prior therapy. 2. Pain secondary to #1. Improved. She has discontinued OxyContin. 3. Intermittent nausea. Resolved. She has discontinued metoclopramide. 4. Firm mobile lymph node at the medial left inguinal region. Question malignant. No inguinal nodes on exam today.  5. Hospitalization 07/02/2012 with possible dystonic reaction related to IV Phenergan. Resolved. 6. C. difficile colitis. She completed a course of oral vancomycin. The diarrhea resolved. 7. EGD 07/08/2012 with finding of a large amount of retained gastric contents. Question gastroparesis.  8. Anorexia/weight loss/early satiety. Improved. 9. Depression 10. Status post right transgluteal drainage catheter placement 12/03/2012 for presacral fluid collection. The drain was removed on 12/09/2012. 11. Onset of severe low abdominal pain 12/09/2012 status post evaluation at [facility]. The drainage catheter was removed. Her pain resolved over the course of the day. 12. Urinary incontinence-likely related to  radiation and pelvic surgery. She was referred to physical therapy.   Disposition: She appears stable. She is in  clinical remission from rectal cancer. We will followup on the CEA from today. She will return in 3 months for labs and in 6 months for labs and an office visit. She will contact the office in the interim with any problems.  We recommended a referral to Dr. Paulita Fujita for a colonoscopy. She has contacted Dr. Morton Stall office for a recommendation as well. She will let us know if we need to make the referral to Dr. Paulita Fujita.  Plan reviewed with Dr. Benay Spice.    Owens Shark ANP/GNP-BC   08/04/2013  4:42 PM

## 2013-08-04 NOTE — Telephone Encounter (Signed)
gv adn pritned appts cehd adn avs for pt for Aug

## 2013-08-05 LAB — CEA: CEA: <0.5 ng/mL (ref 0.0–5.0)

## 2013-11-04 ENCOUNTER — Telehealth: Payer: Self-pay | Admitting: *Deleted

## 2013-11-04 ENCOUNTER — Telehealth: Payer: Self-pay | Admitting: Oncology

## 2013-11-04 ENCOUNTER — Other Ambulatory Visit: Payer: BC Managed Care – PPO

## 2013-11-04 ENCOUNTER — Other Ambulatory Visit: Payer: Self-pay | Admitting: *Deleted

## 2013-11-04 DIAGNOSIS — C20 Malignant neoplasm of rectum: Secondary | ICD-10-CM

## 2013-11-04 NOTE — Telephone Encounter (Signed)
Received call from patient requesting MD order a Vitamin D level.  She has labs scheduled for today, although she is requesting to move lab work to next week.  Dr. Benay Spice agreeable to ordering Vitamin D level.  Patient denies any complaints and understands she has a MD visit with Dr. Benay Spice in November 2015.

## 2013-11-04 NOTE — Telephone Encounter (Signed)
Pt cld to r/s labs 72/09 to 47/09, conflict in sch...Marland KitchenMarland KitchenKJ

## 2013-11-09 ENCOUNTER — Other Ambulatory Visit (HOSPITAL_BASED_OUTPATIENT_CLINIC_OR_DEPARTMENT_OTHER): Payer: BC Managed Care – PPO

## 2013-11-09 DIAGNOSIS — C20 Malignant neoplasm of rectum: Secondary | ICD-10-CM

## 2013-11-09 LAB — CBC WITH DIFFERENTIAL/PLATELET
BASO%: 0.5 % (ref 0.0–2.0)
Basophils Absolute: 0 10*3/uL (ref 0.0–0.1)
EOS ABS: 0.1 10*3/uL (ref 0.0–0.5)
EOS%: 3 % (ref 0.0–7.0)
HCT: 36.9 % (ref 34.8–46.6)
HGB: 12.4 g/dL (ref 11.6–15.9)
LYMPH%: 10.8 % — AB (ref 14.0–49.7)
MCH: 30.7 pg (ref 25.1–34.0)
MCHC: 33.5 g/dL (ref 31.5–36.0)
MCV: 91.8 fL (ref 79.5–101.0)
MONO#: 0.4 10*3/uL (ref 0.1–0.9)
MONO%: 9.4 % (ref 0.0–14.0)
NEUT%: 76.3 % (ref 38.4–76.8)
NEUTROS ABS: 3 10*3/uL (ref 1.5–6.5)
Platelets: 235 10*3/uL (ref 145–400)
RBC: 4.02 10*6/uL (ref 3.70–5.45)
RDW: 13 % (ref 11.2–14.5)
WBC: 3.9 10*3/uL (ref 3.9–10.3)
lymph#: 0.4 10*3/uL — ABNORMAL LOW (ref 0.9–3.3)

## 2013-11-10 ENCOUNTER — Telehealth: Payer: Self-pay | Admitting: *Deleted

## 2013-11-10 LAB — CEA: CEA: <0.5 ng/mL (ref 0.0–5.0)

## 2013-11-10 LAB — VITAMIN D 25 HYDROXY (VIT D DEFICIENCY, FRACTURES): Vit D, 25-Hydroxy: 89 ng/mL (ref 30–89)

## 2013-11-10 NOTE — Telephone Encounter (Signed)
Left message on voicemail informing pt of normal Vit D level.

## 2013-11-10 NOTE — Telephone Encounter (Signed)
Message copied by Brien Few on Thu Nov 10, 2013  4:27 PM ------      Message from: Leah Le      Created: Thu Nov 10, 2013  8:32 AM       Please call patient, vit. D level is normal ------

## 2013-11-11 ENCOUNTER — Telehealth: Payer: Self-pay | Admitting: *Deleted

## 2013-11-11 NOTE — Telephone Encounter (Signed)
Message copied by Domenic Schwab on Fri Nov 11, 2013 12:46 PM ------      Message from: Betsy Coder B      Created: Thu Nov 10, 2013  2:24 PM       Please call patient, cea is normal ------

## 2013-11-11 NOTE — Telephone Encounter (Signed)
Per Dr. Benay Spice; notified pt cea is normal, vit D level is normal and result values given.  Pt verbalized understanding and expressed appreciation for call.

## 2013-12-14 ENCOUNTER — Telehealth: Payer: Self-pay | Admitting: Oncology

## 2013-12-14 NOTE — Telephone Encounter (Signed)
S/w pt to advise appt chg from 11/23 to 11/27 due to md on call. Pt states she needs an appt after 2pm. Gave appt 121/21 @ 3pm.

## 2014-01-06 ENCOUNTER — Other Ambulatory Visit (INDEPENDENT_AMBULATORY_CARE_PROVIDER_SITE_OTHER): Payer: Self-pay | Admitting: Surgery

## 2014-01-06 DIAGNOSIS — R1011 Right upper quadrant pain: Secondary | ICD-10-CM

## 2014-01-11 ENCOUNTER — Other Ambulatory Visit: Payer: BC Managed Care – PPO

## 2014-01-13 ENCOUNTER — Ambulatory Visit
Admission: RE | Admit: 2014-01-13 | Discharge: 2014-01-13 | Disposition: A | Discharge status: Home / Self Care | Payer: BC Managed Care – PPO | Source: Ambulatory Visit | Attending: Surgery | Admitting: Surgery

## 2014-01-13 DIAGNOSIS — R1011 Right upper quadrant pain: Secondary | ICD-10-CM

## 2014-02-06 ENCOUNTER — Other Ambulatory Visit: Payer: BC Managed Care – PPO

## 2014-02-06 ENCOUNTER — Ambulatory Visit: Payer: BC Managed Care – PPO | Admitting: Oncology

## 2014-02-10 ENCOUNTER — Other Ambulatory Visit: Payer: BC Managed Care – PPO

## 2014-02-10 ENCOUNTER — Ambulatory Visit: Payer: BC Managed Care – PPO | Admitting: Oncology

## 2014-03-06 ENCOUNTER — Other Ambulatory Visit: Payer: Self-pay | Admitting: Oncology

## 2014-03-06 ENCOUNTER — Ambulatory Visit (HOSPITAL_BASED_OUTPATIENT_CLINIC_OR_DEPARTMENT_OTHER): Payer: BC Managed Care – PPO | Admitting: Lab

## 2014-03-06 ENCOUNTER — Telehealth: Payer: Self-pay | Admitting: Oncology

## 2014-03-06 ENCOUNTER — Ambulatory Visit (HOSPITAL_BASED_OUTPATIENT_CLINIC_OR_DEPARTMENT_OTHER): Payer: BC Managed Care – PPO | Admitting: Oncology

## 2014-03-06 VITALS — BP 112/74 | HR 83 | Temp 98.4°F | Resp 18 | Ht 63.0 in | Wt 115.1 lb

## 2014-03-06 DIAGNOSIS — C20 Malignant neoplasm of rectum: Secondary | ICD-10-CM

## 2014-03-06 DIAGNOSIS — Z933 Colostomy status: Secondary | ICD-10-CM

## 2014-03-06 DIAGNOSIS — Z85048 Personal history of other malignant neoplasm of rectum, rectosigmoid junction, and anus: Secondary | ICD-10-CM

## 2014-03-06 LAB — CBC WITH DIFFERENTIAL/PLATELET
BASO%: 0.2 % (ref 0.0–2.0)
Basophils Absolute: 0 10*3/uL (ref 0.0–0.1)
EOS%: 2.1 % (ref 0.0–7.0)
Eosinophils Absolute: 0.1 10*3/uL (ref 0.0–0.5)
HEMATOCRIT: 39 % (ref 34.8–46.6)
HGB: 12.8 g/dL (ref 11.6–15.9)
LYMPH#: 0.5 10*3/uL — AB (ref 0.9–3.3)
LYMPH%: 9.3 % — ABNORMAL LOW (ref 14.0–49.7)
MCH: 30.3 pg (ref 25.1–34.0)
MCHC: 32.9 g/dL (ref 31.5–36.0)
MCV: 92.1 fL (ref 79.5–101.0)
MONO#: 0.4 10*3/uL (ref 0.1–0.9)
MONO%: 8.5 % (ref 0.0–14.0)
NEUT#: 3.9 10*3/uL (ref 1.5–6.5)
NEUT%: 79.9 % — AB (ref 38.4–76.8)
Platelets: 260 10*3/uL (ref 145–400)
RBC: 4.23 10*6/uL (ref 3.70–5.45)
RDW: 12.8 % (ref 11.2–14.5)
WBC: 4.9 10*3/uL (ref 3.9–10.3)

## 2014-03-06 NOTE — Progress Notes (Signed)
[place] OFFICE PROGRESS NOTE   Diagnosis: Rectal cancer  INTERVAL HISTORY:   Leah Le returns as scheduled. She feels well. She irrigates the colostomy each morning and does not have a bowel movement for the next 24 hours. She is working. She is taking herbs/vitamins via an alternative medicine practice. She saw Dr. Morton Stall in October. Restaging CT scans revealed no evidence of recurrent rectal cancer. She reports undergoing a surveillance colonoscopy by Dr. Paulita Fujita.  Objective:  Vital signs in last 24 hours:  Blood pressure 112/74, pulse 83, temperature 98.4 F (36.9 C), temperature source Oral, resp. rate 18, height _0  (1.6 m), weight 115 lb 1.6 oz (52.209 kg), SpO2 97 %.    HEENT: Neck without mass Lymphatics: No cervical, supra-clavicular, axillary, or inguinal nodes Resp: Lungs clear bilaterally Cardio: Regular rate and rhythm GI: No hepatomegaly, no mass. Left lower quadrant colostomy. Vascular: No leg edema  Skin: Perineal scar without evidence of recurrent tumor    Lab Results:  Lab Results  Component Value Date   WBC 4.9 03/06/2014   HGB 12.8 03/06/2014   HCT 39.0 03/06/2014   MCV 92.1 03/06/2014   PLT 260 03/06/2014   NEUTROABS 3.9 03/06/2014      Lab Results  Component Value Date   CEA <0.5 11/09/2013    Imaging:  No results found.  Medications: I have reviewed the patient's current medications.  Assessment/Plan: 1. Rectal cancer. Low rectal cancer with probable involvement of the anal margin/sphincter musculature status post surgical biopsy 05/13/2012 confirming adenocarcinoma with lymphovascular invasion, uT3uN0. Staging PET scan 06/01/2012 revealed increased FDG activity at the rectum, increased FDG activity of multiple abdominal/pelvic lymph nodes including a retroperitoneal node between the aorta and IVC and left inguinal nodes. She began radiation and Xeloda on 06/09/2012. Xeloda and radiation were placed on hold during the  07/02/2012 hospitalization. Radiation was resumed. Xeloda was resumed 07/12/2012. Concurrent Xeloda and radiation completed 07/23/2012.  Status post an APR on 09/15/2012 with the final pathology confirming a ypT2, N0 tumor, no loss of expression of mismatch repair proteins, negative surgical margins.   CT chest/abdomen/pelvis on 11/01/2012 showed no evidence of metastatic disease or acute process in the chest. Interval substantial improvement in the previously demonstrated enlarged retroperitoneal pelvic lymph nodes which were hypermetabolic on PET-CT. No pathologically enlarged lymph nodes were identified. Distal colon resection and descending colostomy noted. No evidence of bowel obstruction or recurrent mucosal lesion. A moderate amount of free fluid between the uterus and the sacrum and soft tissue stranding in the posterior pelvic fat.   Initiation of adjuvant Xeloda 11/19/2012, cycle 5 completed beginning on 02/11/2013.  CT scan 04/28/2013 done at [facility]. No metastatic disease within the chest, abdomen or pelvis. Marked interval reduction of presacral fluid collection. Persistent presacral soft tissue stranding and thickening likely related to prior therapy.  Restaging CTs at [facility] 12/28/2013-no evidence of metastatic disease 2. Pain secondary to #1. Improved. She has discontinued OxyContin. 3. Intermittent nausea. Resolved. She has discontinued metoclopramide. 4. Firm mobile lymph node at the medial left inguinal region. Question malignant. No inguinal nodes on exam today.  5. Hospitalization 07/02/2012 with possible dystonic reaction related to IV Phenergan. Resolved. 6. C. difficile colitis. She completed a course of oral vancomycin. The diarrhea resolved. 7. EGD 07/08/2012 with finding of a large amount of retained gastric contents. Question gastroparesis.  8. Anorexia/weight loss/early satiety. Improved. 9. History of Depression 10. Status post right transgluteal drainage  catheter placement 12/03/2012 for presacral fluid  collection. The drain was removed on 12/09/2012. 11. Onset of severe low abdominal pain 12/09/2012 status post evaluation at [facility]. The drainage catheter was removed. Her pain resolved over the course of the day. 12. History of Urinary incontinence-likely related to radiation and pelvic surgery. She was referred to physical therapy.    Disposition:  [name] remains in clinical remission from rectal cancer. She will return for an office visit and CEA in 6 months. We will follow-up on the surveillance colonoscopy by Dr. Paulita Fujita.  Betsy Coder, MD  03/06/2014  4:15 PM

## 2014-03-06 NOTE — Telephone Encounter (Signed)
Gave avs & cal for June 2016. °

## 2014-03-07 LAB — CEA: CEA: <0.5 ng/mL (ref 0.0–5.0)

## 2014-08-31 ENCOUNTER — Other Ambulatory Visit (HOSPITAL_BASED_OUTPATIENT_CLINIC_OR_DEPARTMENT_OTHER): Payer: Managed Care, Other (non HMO)

## 2014-08-31 ENCOUNTER — Ambulatory Visit (HOSPITAL_BASED_OUTPATIENT_CLINIC_OR_DEPARTMENT_OTHER): Payer: Managed Care, Other (non HMO) | Admitting: Oncology

## 2014-08-31 VITALS — BP 115/66 | HR 69 | Temp 98.2°F | Resp 18 | Ht 63.0 in | Wt 116.2 lb

## 2014-08-31 DIAGNOSIS — C20 Malignant neoplasm of rectum: Secondary | ICD-10-CM

## 2014-08-31 DIAGNOSIS — Z85048 Personal history of other malignant neoplasm of rectum, rectosigmoid junction, and anus: Secondary | ICD-10-CM | POA: Diagnosis not present

## 2014-08-31 LAB — CBC WITH DIFFERENTIAL/PLATELET
BASO%: 0.2 % (ref 0.0–2.0)
Basophils Absolute: 0 10*3/uL (ref 0.0–0.1)
EOS%: 1.9 % (ref 0.0–7.0)
Eosinophils Absolute: 0.1 10*3/uL (ref 0.0–0.5)
HCT: 40.7 % (ref 34.8–46.6)
HEMOGLOBIN: 13.9 g/dL (ref 11.6–15.9)
LYMPH%: 11.7 % — AB (ref 14.0–49.7)
MCH: 31.1 pg (ref 25.1–34.0)
MCHC: 34.2 g/dL (ref 31.5–36.0)
MCV: 91.1 fL (ref 79.5–101.0)
MONO#: 0.4 10*3/uL (ref 0.1–0.9)
MONO%: 9.3 % (ref 0.0–14.0)
NEUT#: 3.2 10*3/uL (ref 1.5–6.5)
NEUT%: 76.9 % — ABNORMAL HIGH (ref 38.4–76.8)
PLATELETS: 267 10*3/uL (ref 145–400)
RBC: 4.47 10*6/uL (ref 3.70–5.45)
RDW: 12.8 % (ref 11.2–14.5)
WBC: 4.2 10*3/uL (ref 3.9–10.3)
lymph#: 0.5 10*3/uL — ABNORMAL LOW (ref 0.9–3.3)

## 2014-08-31 MED ORDER — ALPRAZOLAM 0.5 MG PO TABS: ORAL_TABLET | ORAL | Status: AC

## 2014-08-31 NOTE — Progress Notes (Signed)
[place] OFFICE PROGRESS NOTE   Diagnosis: Rectal cancer  INTERVAL HISTORY:   Ms. Corsello returns as scheduled. She feels well. The colostomy is functioning well. She irrigates the stoma once daily. She changed to have an abnormal urinary stream, she reports seeing a physician at [facility] who confirmed this was related to surgery and radiation.  Objective:  Vital signs in last 24 hours:  Blood pressure 115/66, pulse 69, temperature 98.2 F (36.8 C), temperature source Oral, resp. rate 18, height 5' 3" (1.6 m), weight 116 lb 3.2 oz (52.708 kg), SpO2 100 %.    HEENT: Neck without mass Lymphatics: No cervical, supra-clavicular, axillary, or inguinal nodes Resp: Lungs clear bilaterally Cardio: Regular rate and rhythm GI: No hepatosplenomegaly, left lower quadrant colostomy, nontender, no mass Vascular: No leg edema  Skin: Perineal scar without evidence of recurrent tumor     Lab Results:  Lab Results  Component Value Date   WBC 4.2 08/31/2014   HGB 13.9 08/31/2014   HCT 40.7 08/31/2014   MCV 91.1 08/31/2014   PLT 267 08/31/2014   NEUTROABS 3.2 08/31/2014     Lab Results  Component Value Date   CEA <0.5 03/06/2014     Medications: I have reviewed the patient's current medications.  Assessment/Plan: 1. Rectal cancer. Low rectal cancer with probable involvement of the anal margin/sphincter musculature status post surgical biopsy 05/13/2012 confirming adenocarcinoma with lymphovascular invasion, uT3uN0. Staging PET scan 06/01/2012 revealed increased FDG activity at the rectum, increased FDG activity of multiple abdominal/pelvic lymph nodes including a retroperitoneal node between the aorta and IVC and left inguinal nodes. She began radiation and Xeloda on 06/09/2012. Xeloda and radiation were placed on hold during the 07/02/2012 hospitalization. Radiation was resumed. Xeloda was resumed 07/12/2012. Concurrent Xeloda and radiation completed  07/23/2012.  Status post an APR on 09/15/2012 with the final pathology confirming a ypT2, N0 tumor, no loss of expression of mismatch repair proteins, negative surgical margins.   CT chest/abdomen/pelvis on 11/01/2012 showed no evidence of metastatic disease or acute process in the chest. Interval substantial improvement in the previously demonstrated enlarged retroperitoneal pelvic lymph nodes which were hypermetabolic on PET-CT. No pathologically enlarged lymph nodes were identified. Distal colon resection and descending colostomy noted. No evidence of bowel obstruction or recurrent mucosal lesion. A moderate amount of free fluid between the uterus and the sacrum and soft tissue stranding in the posterior pelvic fat.   Initiation of adjuvant Xeloda 11/19/2012, cycle 5 completed beginning on 02/11/2013.  CT scan 04/28/2013 done at [facility]. No metastatic disease within the chest, abdomen or pelvis. Marked interval reduction of presacral fluid collection. Persistent presacral soft tissue stranding and thickening likely related to prior therapy.  Restaging CTs at [facility] 12/28/2013-no evidence of metastatic disease 2. Pain secondary to #1. Improved. She has discontinued OxyContin. 3. Intermittent nausea. Resolved. She has discontinued metoclopramide. 4. History of a Firm mobile lymph node at the medial left inguinal region. Question malignant. No inguinal nodes on exam today.  5. Hospitalization 07/02/2012 with possible dystonic reaction related to IV Phenergan. Resolved. 6. C. difficile colitis. She completed a course of oral vancomycin. The diarrhea resolved. 7. EGD 07/08/2012 with finding of a large amount of retained gastric contents. Question gastroparesis.  8. Anorexia/weight loss/early satiety. Improved. 9. History of Depression 10. Status post right transgluteal drainage catheter placement 12/03/2012 for presacral fluid collection. The drain was removed on 12/09/2012. 11. Onset of  severe low abdominal pain 12/09/2012 status post evaluation at [facility].  The drainage catheter was removed. Her pain resolved over the course of the day. 12. History of Urinary incontinence-likely related to radiation and pelvic surgery. She was referred to physical therapy.  Disposition:  Ms. Pletz is in clinical remission from rectal cancer. She will see Dr. Morton Stall in October with restaging CTs. She will return for an office visit and CEA in 6 months.  Betsy Coder, MD  08/31/2014  3:16 PM

## 2014-09-01 ENCOUNTER — Telehealth: Payer: Self-pay | Admitting: Oncology

## 2014-09-01 LAB — CEA: CEA: <0.5 ng/mL (ref 0.0–5.0)

## 2014-09-01 NOTE — Telephone Encounter (Addendum)
Lft msg for pt confirming labs/ov per 06/16 POF, mailed pt Calendar..... KJ

## 2014-12-31 ENCOUNTER — Telehealth: Payer: Self-pay | Admitting: Oncology

## 2014-12-31 NOTE — Telephone Encounter (Signed)
s.w. pt and r/s 12.16 appt to Jan per pt request due to MD on pal...pt ok and aware of new d.t

## 2015-03-02 ENCOUNTER — Ambulatory Visit: Payer: Managed Care, Other (non HMO) | Admitting: Oncology

## 2015-03-02 ENCOUNTER — Other Ambulatory Visit: Payer: Managed Care, Other (non HMO)

## 2015-03-09 ENCOUNTER — Other Ambulatory Visit: Payer: Managed Care, Other (non HMO)

## 2015-03-29 ENCOUNTER — Other Ambulatory Visit: Payer: Self-pay | Admitting: *Deleted

## 2015-03-29 ENCOUNTER — Ambulatory Visit (HOSPITAL_BASED_OUTPATIENT_CLINIC_OR_DEPARTMENT_OTHER): Payer: Managed Care, Other (non HMO) | Admitting: Oncology

## 2015-03-29 ENCOUNTER — Other Ambulatory Visit (HOSPITAL_BASED_OUTPATIENT_CLINIC_OR_DEPARTMENT_OTHER): Payer: Managed Care, Other (non HMO)

## 2015-03-29 VITALS — BP 115/68 | HR 71 | Temp 98.6°F | Resp 18 | Ht 63.0 in | Wt 119.0 lb

## 2015-03-29 DIAGNOSIS — Z85048 Personal history of other malignant neoplasm of rectum, rectosigmoid junction, and anus: Secondary | ICD-10-CM | POA: Diagnosis not present

## 2015-03-29 DIAGNOSIS — J3489 Other specified disorders of nose and nasal sinuses: Secondary | ICD-10-CM | POA: Diagnosis not present

## 2015-03-29 DIAGNOSIS — C20 Malignant neoplasm of rectum: Secondary | ICD-10-CM

## 2015-03-29 LAB — CBC WITH DIFFERENTIAL/PLATELET
BASO%: 0.3 % (ref 0.0–2.0)
Basophils Absolute: 0 10*3/uL (ref 0.0–0.1)
EOS%: 2.7 % (ref 0.0–7.0)
Eosinophils Absolute: 0.1 10*3/uL (ref 0.0–0.5)
HCT: 36 % (ref 34.8–46.6)
HGB: 12.5 g/dL (ref 11.6–15.9)
LYMPH%: 13.6 % — AB (ref 14.0–49.7)
MCH: 31.3 pg (ref 25.1–34.0)
MCHC: 34.7 g/dL (ref 31.5–36.0)
MCV: 90 fL (ref 79.5–101.0)
MONO#: 0.4 10*3/uL (ref 0.1–0.9)
MONO%: 12.1 % (ref 0.0–14.0)
NEUT%: 71.3 % (ref 38.4–76.8)
NEUTROS ABS: 2.4 10*3/uL (ref 1.5–6.5)
Platelets: 267 10*3/uL (ref 145–400)
RBC: 4 10*6/uL (ref 3.70–5.45)
RDW: 12.7 % (ref 11.2–14.5)
WBC: 3.4 10*3/uL — AB (ref 3.9–10.3)
lymph#: 0.5 10*3/uL — ABNORMAL LOW (ref 0.9–3.3)

## 2015-03-29 MED ORDER — AZITHROMYCIN 250 MG PO TABS: ORAL_TABLET | ORAL | Status: AC

## 2015-03-29 NOTE — Progress Notes (Signed)
[place] OFFICE PROGRESS NOTE   Diagnosis: Rectal cancer  INTERVAL HISTORY:   Ms. Hou returns as scheduled. She feels well in general. She saw Dr. Morton Stall in October 2016. Restaging CT scans revealed no evidence of recurrent tumor. She had a sinus infection last year and has similar pressure at the right maxillary sinus for the past few days. She requests an antibiotic as this helped in the past.  Objective:  Vital signs in last 24 hours:  Blood pressure 115/68, pulse 71, temperature 98.6 F (37 C), temperature source Oral, resp. rate 18, height 5' 3"  (1.6 m), weight 119 lb (53.978 kg), SpO2 100 %.    HEENT: No sinus tenderness. Lymphatics: No cervical, supra-clavicular, axillary, or inguinal nodes Resp: Lungs clear bilaterally Cardio: Regular rate and rhythm GI: No hepatosplenomegaly, no mass, left lower quadrant ostomy, slight firm fullness superior to the umbilicus without a discrete mass Vascular: No leg edema  Skin: Perineal scar without evidence of recurrent tumor    Lab Results:  Lab Results  Component Value Date   WBC 3.4* 03/29/2015   HGB 12.5 03/29/2015   HCT 36.0 03/29/2015   MCV 90.0 03/29/2015   PLT 267 03/29/2015   NEUTROABS 2.4 03/29/2015    Absolute lymphocyte count 0.5  Lab Results  Component Value Date   CEA <0.5 08/31/2014     Medications: I have reviewed the patient's current medications.  Assessment/Plan: 1. Rectal cancer. Low rectal cancer with probable involvement of the anal margin/sphincter musculature status post surgical biopsy 05/13/2012 confirming adenocarcinoma with lymphovascular invasion, uT3uN0. Staging PET scan 06/01/2012 revealed increased FDG activity at the rectum, increased FDG activity of multiple abdominal/pelvic lymph nodes including a retroperitoneal node between the aorta and IVC and left inguinal nodes. She began radiation and Xeloda on 06/09/2012. Xeloda and radiation were placed on hold during the  07/02/2012 hospitalization. Radiation was resumed. Xeloda was resumed 07/12/2012. Concurrent Xeloda and radiation completed 07/23/2012.  Status post an APR on 09/15/2012 with the final pathology confirming a ypT2, N0 tumor, no loss of expression of mismatch repair proteins, negative surgical margins.   CT chest/abdomen/pelvis on 11/01/2012 showed no evidence of metastatic disease or acute process in the chest. Interval substantial improvement in the previously demonstrated enlarged retroperitoneal pelvic lymph nodes which were hypermetabolic on PET-CT. No pathologically enlarged lymph nodes were identified. Distal colon resection and descending colostomy noted. No evidence of bowel obstruction or recurrent mucosal lesion. A moderate amount of free fluid between the uterus and the sacrum and soft tissue stranding in the posterior pelvic fat.   Initiation of adjuvant Xeloda 11/19/2012, cycle 5 completed beginning on 02/11/2013.  CT scan 04/28/2013 done at [facility]. No metastatic disease within the chest, abdomen or pelvis. Marked interval reduction of presacral fluid collection. Persistent presacral soft tissue stranding and thickening likely related to prior therapy.  Restaging CTs at [facility] 12/28/2013-no evidence of metastatic disease  Restaging CT scans at [facility] 12/28/2014-no evidence of metastatic disease 2. History of Pain secondary to #1. Improved.  3. Intermittent nausea. Resolved. She has discontinued metoclopramide. 4. History of a Firm mobile lymph node at the medial left inguinal region. Question malignant. No inguinal nodes on exam today.  5. Hospitalization 07/02/2012 with possible dystonic reaction related to IV Phenergan. Resolved. 6. C. difficile colitis. She completed a course of oral vancomycin. The diarrhea resolved. 7. EGD 07/08/2012 with finding of a large amount of retained gastric contents. Question gastroparesis.  8. Anorexia/weight loss/early satiety.  Improved. 9.  History of Depression 10. Status post right transgluteal drainage catheter placement 12/03/2012 for presacral fluid collection. The drain was removed on 12/09/2012. 11. Onset of severe low abdominal pain 12/09/2012 status post evaluation at [facility]. The drainage catheter was removed. Her pain resolved over the course of the day. 12. History of Urinary incontinence-likely related to radiation and pelvic surgery. She was referred to physical therapy.    Disposition:  Ms. Buckel remains in clinical remission from rectal cancer. She will return for an office visit and CEA in 6 months. The lymphopenia is likely related to radiation.  She appears to have a sinus infection. We prescribed azithromycin.  Betsy Coder, MD  03/29/2015  4:35 PM

## 2015-03-30 ENCOUNTER — Telehealth: Payer: Self-pay | Admitting: Oncology

## 2015-03-30 LAB — CEA (PARALLEL TESTING): CEA: <0.5 ng/mL (ref 0.0–5.0)

## 2015-03-30 LAB — CEA: CEA: 0.2 ng/mL (ref 0.0–4.7)

## 2015-03-30 NOTE — Telephone Encounter (Signed)
Mailed out calendar of 6 month appt per pof 03/29/15

## 2015-04-02 ENCOUNTER — Telehealth: Payer: Self-pay | Admitting: *Deleted

## 2015-04-02 NOTE — Telephone Encounter (Signed)
-----   Message from Ladell Pier, MD sent at 03/30/2015  5:40 PM EST ----- Please call patient, cea is normal

## 2015-04-02 NOTE — Telephone Encounter (Signed)
Per Dr. Sherrill; notified pt that cea is normal.  Pt verbalized understanding. 

## 2015-04-16 ENCOUNTER — Encounter: Payer: Self-pay | Admitting: *Deleted

## 2015-09-16 ENCOUNTER — Telehealth: Payer: Self-pay | Admitting: Oncology

## 2015-09-16 NOTE — Telephone Encounter (Signed)
PAL - moved 7/13 appointments to 8/3. Left message for patient and mailed schedule. Patient also mychart active.

## 2015-09-27 ENCOUNTER — Other Ambulatory Visit: Payer: Managed Care, Other (non HMO)

## 2015-09-27 ENCOUNTER — Ambulatory Visit: Payer: Managed Care, Other (non HMO) | Admitting: Oncology

## 2015-10-18 ENCOUNTER — Ambulatory Visit: Payer: Managed Care, Other (non HMO) | Admitting: Oncology

## 2015-10-18 ENCOUNTER — Other Ambulatory Visit: Payer: Managed Care, Other (non HMO)

## 2015-10-18 ENCOUNTER — Telehealth: Payer: Self-pay | Admitting: *Deleted

## 2015-10-18 NOTE — Telephone Encounter (Signed)
Pt did not keep appt today. Left message informing her scheduler will call to reschedule.

## 2015-10-19 ENCOUNTER — Telehealth: Payer: Self-pay | Admitting: Oncology

## 2015-10-19 NOTE — Telephone Encounter (Signed)
cld & spoke w/pt-pt stated she did not r/s appt to8/3 and at this time does not need tp r/s any appt at this time

## 2015-11-06 ENCOUNTER — Other Ambulatory Visit: Payer: Managed Care, Other (non HMO)

## 2015-11-06 ENCOUNTER — Ambulatory Visit: Payer: Managed Care, Other (non HMO) | Admitting: Oncology

## 2022-08-25 ENCOUNTER — Other Ambulatory Visit: Payer: Self-pay | Admitting: Gastroenterology

## 2022-08-25 DIAGNOSIS — R109 Unspecified abdominal pain: Secondary | ICD-10-CM

## 2022-08-25 DIAGNOSIS — R112 Nausea with vomiting, unspecified: Secondary | ICD-10-CM

## 2022-08-27 ENCOUNTER — Ambulatory Visit
Admission: RE | Admit: 2022-08-27 | Discharge: 2022-08-27 | Discharge status: Home / Self Care | Payer: Managed Care, Other (non HMO) | Source: Ambulatory Visit | Attending: Gastroenterology | Admitting: Gastroenterology

## 2022-08-27 DIAGNOSIS — R112 Nausea with vomiting, unspecified: Secondary | ICD-10-CM

## 2022-08-27 DIAGNOSIS — R109 Unspecified abdominal pain: Secondary | ICD-10-CM
